# Patient Record
Sex: Female | Born: 1940 | Race: White | Hispanic: No | Marital: Married | Smoking: Former smoker
Health system: Southern US, Community
[De-identification: ages and names within clinical notes are randomized; demographics above are authoritative.]

## PROBLEM LIST (undated history)

## (undated) DIAGNOSIS — E7800 Pure hypercholesterolemia, unspecified: Secondary | ICD-10-CM

## (undated) DIAGNOSIS — M549 Dorsalgia, unspecified: Secondary | ICD-10-CM

## (undated) DIAGNOSIS — Z87442 Personal history of urinary calculi: Secondary | ICD-10-CM

## (undated) DIAGNOSIS — M419 Scoliosis, unspecified: Secondary | ICD-10-CM

## (undated) DIAGNOSIS — K219 Gastro-esophageal reflux disease without esophagitis: Secondary | ICD-10-CM

## (undated) DIAGNOSIS — R0600 Dyspnea, unspecified: Secondary | ICD-10-CM

## (undated) DIAGNOSIS — M1990 Unspecified osteoarthritis, unspecified site: Secondary | ICD-10-CM

## (undated) DIAGNOSIS — E119 Type 2 diabetes mellitus without complications: Secondary | ICD-10-CM

## (undated) DIAGNOSIS — I10 Essential (primary) hypertension: Secondary | ICD-10-CM

## (undated) DIAGNOSIS — H409 Unspecified glaucoma: Secondary | ICD-10-CM

## (undated) DIAGNOSIS — D369 Benign neoplasm, unspecified site: Secondary | ICD-10-CM

## (undated) DIAGNOSIS — K52832 Lymphocytic colitis: Secondary | ICD-10-CM

## (undated) HISTORY — DX: Scoliosis, unspecified: M41.9

## (undated) HISTORY — PX: TONSILLECTOMY: SUR1361

## (undated) HISTORY — DX: Dorsalgia, unspecified: M54.9

## (undated) HISTORY — PX: CATARACT EXTRACTION, BILATERAL: SHX1313

## (undated) HISTORY — DX: Pure hypercholesterolemia, unspecified: E78.00

## (undated) HISTORY — PX: EYE SURGERY: SHX253

## (undated) HISTORY — PX: BACK SURGERY: SHX140

## (undated) HISTORY — PX: GANGLION CYST EXCISION: SHX1691

## (undated) HISTORY — DX: Essential (primary) hypertension: I10

## (undated) HISTORY — PX: ABDOMINAL HYSTERECTOMY: SHX81

## (undated) HISTORY — DX: Benign neoplasm, unspecified site: D36.9

## (undated) HISTORY — DX: Type 2 diabetes mellitus without complications: E11.9

## (undated) HISTORY — DX: Gastro-esophageal reflux disease without esophagitis: K21.9

## (undated) HISTORY — PX: GALLBLADDER SURGERY: SHX652

## (undated) HISTORY — DX: Lymphocytic colitis: K52.832

---

## 2000-04-27 ENCOUNTER — Encounter: Payer: Self-pay | Admitting: Neurosurgery

## 2000-04-28 ENCOUNTER — Inpatient Hospital Stay (HOSPITAL_COMMUNITY): Admission: RE | Admit: 2000-04-28 | Discharge: 2000-04-29 | Payer: Self-pay | Admitting: Neurosurgery

## 2000-04-28 ENCOUNTER — Encounter: Payer: Self-pay | Admitting: Neurosurgery

## 2000-07-23 ENCOUNTER — Ambulatory Visit (HOSPITAL_COMMUNITY): Admission: RE | Admit: 2000-07-23 | Discharge: 2000-07-23 | Payer: Self-pay | Admitting: Neurosurgery

## 2000-07-23 ENCOUNTER — Encounter: Payer: Self-pay | Admitting: Neurosurgery

## 2000-10-07 ENCOUNTER — Encounter: Payer: Self-pay | Admitting: Neurosurgery

## 2000-10-07 ENCOUNTER — Ambulatory Visit (HOSPITAL_COMMUNITY): Admission: RE | Admit: 2000-10-07 | Discharge: 2000-10-07 | Payer: Self-pay | Admitting: Neurosurgery

## 2000-11-03 ENCOUNTER — Encounter: Admission: RE | Admit: 2000-11-03 | Discharge: 2000-11-03 | Payer: Self-pay | Admitting: Neurosurgery

## 2000-11-03 ENCOUNTER — Encounter: Payer: Self-pay | Admitting: Neurosurgery

## 2000-11-17 ENCOUNTER — Encounter: Payer: Self-pay | Admitting: Neurosurgery

## 2000-11-17 ENCOUNTER — Encounter: Admission: RE | Admit: 2000-11-17 | Discharge: 2000-11-17 | Payer: Self-pay | Admitting: Neurosurgery

## 2001-01-31 ENCOUNTER — Ambulatory Visit (HOSPITAL_COMMUNITY): Admission: RE | Admit: 2001-01-31 | Discharge: 2001-01-31 | Payer: Self-pay | Admitting: Family Medicine

## 2001-01-31 ENCOUNTER — Encounter: Payer: Self-pay | Admitting: Family Medicine

## 2001-10-09 ENCOUNTER — Encounter: Payer: Self-pay | Admitting: Neurosurgery

## 2001-10-09 ENCOUNTER — Encounter: Admission: RE | Admit: 2001-10-09 | Discharge: 2001-10-09 | Payer: Self-pay | Admitting: Neurosurgery

## 2001-10-23 ENCOUNTER — Encounter: Admission: RE | Admit: 2001-10-23 | Discharge: 2001-10-23 | Payer: Self-pay | Admitting: Neurosurgery

## 2001-10-23 ENCOUNTER — Encounter: Payer: Self-pay | Admitting: Neurosurgery

## 2002-02-01 ENCOUNTER — Ambulatory Visit (HOSPITAL_COMMUNITY): Admission: RE | Admit: 2002-02-01 | Discharge: 2002-02-01 | Payer: Self-pay | Admitting: Family Medicine

## 2002-02-01 ENCOUNTER — Encounter: Payer: Self-pay | Admitting: Family Medicine

## 2002-02-05 ENCOUNTER — Encounter: Payer: Self-pay | Admitting: Neurosurgery

## 2002-02-05 ENCOUNTER — Encounter: Admission: RE | Admit: 2002-02-05 | Discharge: 2002-02-05 | Payer: Self-pay | Admitting: Neurosurgery

## 2002-11-19 ENCOUNTER — Ambulatory Visit (HOSPITAL_COMMUNITY): Admission: RE | Admit: 2002-11-19 | Discharge: 2002-11-19 | Payer: Self-pay | Admitting: Internal Medicine

## 2003-01-14 ENCOUNTER — Encounter: Payer: Self-pay | Admitting: Family Medicine

## 2003-01-14 ENCOUNTER — Ambulatory Visit (HOSPITAL_COMMUNITY): Admission: RE | Admit: 2003-01-14 | Discharge: 2003-01-14 | Payer: Self-pay | Admitting: Family Medicine

## 2003-02-20 ENCOUNTER — Ambulatory Visit (HOSPITAL_COMMUNITY): Admission: RE | Admit: 2003-02-20 | Discharge: 2003-02-20 | Payer: Self-pay | Admitting: Orthopaedic Surgery

## 2003-02-20 ENCOUNTER — Encounter: Payer: Self-pay | Admitting: Orthopaedic Surgery

## 2003-05-10 ENCOUNTER — Ambulatory Visit (HOSPITAL_COMMUNITY): Admission: RE | Admit: 2003-05-10 | Discharge: 2003-05-10 | Payer: Self-pay | Admitting: Family Medicine

## 2004-08-25 ENCOUNTER — Ambulatory Visit (HOSPITAL_COMMUNITY): Admission: RE | Admit: 2004-08-25 | Discharge: 2004-08-25 | Payer: Self-pay | Admitting: Family Medicine

## 2006-04-07 ENCOUNTER — Ambulatory Visit (HOSPITAL_COMMUNITY): Admission: RE | Admit: 2006-04-07 | Discharge: 2006-04-07 | Payer: Self-pay | Admitting: Family Medicine

## 2011-03-01 HISTORY — PX: COLONOSCOPY: SHX174

## 2013-05-31 DIAGNOSIS — K52832 Lymphocytic colitis: Secondary | ICD-10-CM

## 2013-05-31 DIAGNOSIS — D369 Benign neoplasm, unspecified site: Secondary | ICD-10-CM

## 2013-05-31 HISTORY — DX: Benign neoplasm, unspecified site: D36.9

## 2013-05-31 HISTORY — DX: Lymphocytic colitis: K52.832

## 2014-04-02 ENCOUNTER — Ambulatory Visit (INDEPENDENT_AMBULATORY_CARE_PROVIDER_SITE_OTHER): Payer: Medicare Other | Admitting: Gastroenterology

## 2014-04-02 ENCOUNTER — Encounter: Payer: Self-pay | Admitting: Gastroenterology

## 2014-04-02 ENCOUNTER — Other Ambulatory Visit: Payer: Self-pay

## 2014-04-02 VITALS — BP 140/74 | HR 76 | Temp 97.0°F | Ht 63.5 in | Wt 156.0 lb

## 2014-04-02 DIAGNOSIS — R197 Diarrhea, unspecified: Secondary | ICD-10-CM

## 2014-04-02 MED ORDER — PEG-KCL-NACL-NASULF-NA ASC-C 100 G PO SOLR: 1.0000 | ORAL | Status: DC

## 2014-04-02 NOTE — Patient Instructions (Signed)
Please complete the stool sample.   Start taking a probiotic daily such as Digestive Advantage, Philip's Colon Health, [employer] brand, Align, or Restora.   We have set you up for a colonoscopy with Dr. Gala Romney as soon as possible for further evaluation.

## 2014-04-02 NOTE — Progress Notes (Signed)
Primary Care Physician:  Clinton Quant, MD Primary Gastroenterologist:  Dr. Gala Romney   Chief Complaint  Patient presents with  . Referral    Constant Diarrhea    HPI:   Diane Hawkins is a very pleasant 73 year old female presenting today at the request of her PCP secondary to persistent diarrhea. She is a retired Marine scientist and excellent historian. Last colonoscopy in 2012 by Dr. Algis Greenhouse with adenomatous rectal polyps.   Oct 14th, acute onset of profuse diarrhea at 0430am. No improvement with Imodium. Has had to throw away underpants. Projectile diarrhea. Fecal urgency, incontinence. Prescribed pepto. No improvement. Stool samples completed. Negative Cdiff (toxin A and b) PCR not done. Moderate yeast in stool culture. Diarrhea slowed down some. Watery stool with pieces. By the 27th, frequency slowed down. 28th, 5 times, 29th 6 times. Yesterday soft stool, pieces. Nothing formed since starting Oct 14th. No imodium yesterday. 1 soft stool today. Well water. No sick contacts. No recent antibiotic exposure. Month ago started Cymbalta for back issues. Stopped Cymbalta. Ativan for sleep last 3 nights and has noted some improvement but not sure it is linked. Possible antibiotic months ago for UTI. Scant paper hematochezia. No abdominal pain. Has lost several pounds. Appetite somewhat less but knows she has to eat. Diabetic so keeps up with her oral intake.   Chronic Nexium. Symptomatic if forgets PPI.   Past Medical History  Diagnosis Date  . Diabetes   . Hypertension   . Hypercholesterolemia   . GERD (gastroesophageal reflux disease)   . Back pain   . Scoliosis     Past Surgical History  Procedure Laterality Date  . Back surgery      spinal stenosis  . Colonoscopy  Oct 2012    Dr. Algis Greenhouse: few small rectal polyps s/p biopsy, adenomatous polyps    Current Outpatient Prescriptions  Medication Sig Dispense Refill  . acetaminophen (TYLENOL) 500 MG tablet Take 500 mg by mouth  every 6 (six) hours as needed for headache.     Marland Kitchen amLODipine (NORVASC) 5 MG tablet Take 5 mg by mouth daily.    Marland Kitchen aspirin 500 MG tablet Take 500 mg by mouth daily.    . carvedilol (COREG) 12.5 MG tablet Take 12.5 mg by mouth 2 (two) times daily.    . Cholecalciferol (VITAMIN D PO) Take 1,000 Units by mouth.    . Esomeprazole Magnesium (NEXIUM PO) Take 1 capsule by mouth daily.     . Insulin Aspart Prot & Aspart (NOVOLOG MIX 70/30 [place]) Inject into the skin. 25 units in the AM 10 units in the PM    . Loperamide HCl (IMODIUM PO) Take 1 tablet by mouth daily as needed (diarrhea).     . LORazepam (ATIVAN) 0.5 MG tablet Take 0.5 mg by mouth daily as needed for anxiety.     . rosuvastatin (CRESTOR) 10 MG tablet Take 10 mg by mouth daily.    . peg 3350 powder (MOVIPREP) 100 G SOLR Take 1 kit (200 g total) by mouth as directed. 1 kit 0  . Probiotic Product (RESTORA PO) Take 1 capsule by mouth daily.     No current facility-administered medications for this visit.    Allergies as of 04/02/2014  . (Not on File)    Family History  Problem Relation Age of Onset  . Colon cancer Neg Hx   . Diverticulitis Mother   . Ulcers Mother     History   Social History  . Marital  Status: Married    Spouse Name: N/A    Number of Children: N/A  . Years of Education: N/A   Occupational History  . Retired Therapist, sports    Social History Main Topics  . Smoking status: Current Some Day Smoker    Types: E-cigarettes  . Smokeless tobacco: Not on file     Comment: E-Cigarette  . Alcohol Use: No  . Drug Use: No  . Sexual Activity: Not on file   Other Topics Concern  . Not on file   Social History Narrative    Review of Systems: As mentioned in HPI  Physical Exam: BP 140/74 mmHg  Pulse 76  Temp(Src) 97 F (36.1 C) (Oral)  Ht 5' 3.5" (1.613 m)  Wt 156 lb (70.761 kg)  BMI 27.20 kg/m2 General:   Alert and oriented. Pleasant and cooperative. Well-nourished and well-developed.  Head:  Normocephalic and  atraumatic. Eyes:  Without icterus, sclera clear and conjunctiva pink.  Ears:  Normal auditory acuity. Nose:  No deformity, discharge,  or lesions. Mouth:  No deformity or lesions, oral mucosa pink.  Lungs:  Clear to auscultation bilaterally. No wheezes, rales, or rhonchi. No distress.  Heart:  S1, S2 present without murmurs appreciated.  Abdomen:  +BS, soft, non-tender and non-distended. No HSM noted. No guarding or rebound. No masses appreciated.  Rectal:  Deferred  Msk:  Symmetrical without gross deformities. Normal posture. Extremities:  Without clubbing or edema. Neurologic:  Alert and  oriented x4;  grossly normal neurologically. Skin:  Intact without significant lesions or rashes. Psych:  Alert and cooperative. Normal mood and affect.

## 2014-04-05 ENCOUNTER — Encounter: Payer: Self-pay | Admitting: Gastroenterology

## 2014-04-05 DIAGNOSIS — R197 Diarrhea, unspecified: Secondary | ICD-10-CM | POA: Insufficient documentation

## 2014-04-05 LAB — CLOSTRIDIUM DIFFICILE BY PCR: Toxigenic C. Difficile by PCR: NOT DETECTED

## 2014-04-05 NOTE — Assessment & Plan Note (Signed)
73 year old female with acute onset of diarrhea several weeks ago, associated urgency and incontinence, several lbs weight loss, with negative stool studies to include Cdiff toxin A & B, ova and parasites. Non-specific finding of moderate yeast in stool culture. Last colonoscopy 2012 at outside facility with adenomatous colon polyps. Due to significant change in bowel habits and associated weight loss, needs updated colonoscopy with random colonic biopsies. I would like to recheck Cdiff by PCR for completeness' sake. Colonoscopy planned in near future. Start probiotic now.   Proceed with TCS with Dr. Gala Romney in near future: the risks, benefits, and alternatives have been discussed with the patient in detail. The patient states understanding and desires to proceed.

## 2014-04-08 ENCOUNTER — Ambulatory Visit: Payer: Self-pay | Admitting: Gastroenterology

## 2014-04-09 ENCOUNTER — Encounter (HOSPITAL_COMMUNITY): Payer: Self-pay | Admitting: *Deleted

## 2014-04-09 ENCOUNTER — Encounter (HOSPITAL_COMMUNITY)
Admission: RE | Disposition: A | Discharge status: Home / Self Care | Payer: Self-pay | Source: Ambulatory Visit | Attending: Internal Medicine

## 2014-04-09 ENCOUNTER — Ambulatory Visit (HOSPITAL_COMMUNITY)
Admission: RE | Admit: 2014-04-09 | Discharge: 2014-04-09 | Disposition: A | Discharge status: Home / Self Care | Payer: Medicare Other | Source: Ambulatory Visit | Attending: Internal Medicine | Admitting: Internal Medicine

## 2014-04-09 DIAGNOSIS — K5730 Diverticulosis of large intestine without perforation or abscess without bleeding: Secondary | ICD-10-CM | POA: Insufficient documentation

## 2014-04-09 DIAGNOSIS — M419 Scoliosis, unspecified: Secondary | ICD-10-CM | POA: Insufficient documentation

## 2014-04-09 DIAGNOSIS — K219 Gastro-esophageal reflux disease without esophagitis: Secondary | ICD-10-CM | POA: Insufficient documentation

## 2014-04-09 DIAGNOSIS — D122 Benign neoplasm of ascending colon: Secondary | ICD-10-CM | POA: Insufficient documentation

## 2014-04-09 DIAGNOSIS — Z7982 Long term (current) use of aspirin: Secondary | ICD-10-CM | POA: Diagnosis not present

## 2014-04-09 DIAGNOSIS — K5289 Other specified noninfective gastroenteritis and colitis: Secondary | ICD-10-CM | POA: Diagnosis not present

## 2014-04-09 DIAGNOSIS — E119 Type 2 diabetes mellitus without complications: Secondary | ICD-10-CM | POA: Insufficient documentation

## 2014-04-09 DIAGNOSIS — R197 Diarrhea, unspecified: Secondary | ICD-10-CM

## 2014-04-09 DIAGNOSIS — Z86010 Personal history of colonic polyps: Secondary | ICD-10-CM

## 2014-04-09 DIAGNOSIS — E780 Pure hypercholesterolemia: Secondary | ICD-10-CM | POA: Insufficient documentation

## 2014-04-09 DIAGNOSIS — I10 Essential (primary) hypertension: Secondary | ICD-10-CM | POA: Diagnosis not present

## 2014-04-09 HISTORY — PX: COLONOSCOPY: SHX5424

## 2014-04-09 LAB — GLUCOSE, CAPILLARY: GLUCOSE-CAPILLARY: 182 mg/dL — AB (ref 70–99)

## 2014-04-09 SURGERY — COLONOSCOPY
Anesthesia: Moderate Sedation

## 2014-04-09 MED ORDER — MEPERIDINE HCL 100 MG/ML IJ SOLN
INTRAMUSCULAR | Status: AC
  Filled 2014-04-09: qty 2

## 2014-04-09 MED ORDER — ONDANSETRON HCL 4 MG/2ML IJ SOLN
INTRAMUSCULAR | Status: DC | PRN
  Administered 2014-04-09: 4 mg via INTRAVENOUS

## 2014-04-09 MED ORDER — ONDANSETRON HCL 4 MG/2ML IJ SOLN
INTRAMUSCULAR | Status: AC
  Filled 2014-04-09: qty 2

## 2014-04-09 MED ORDER — MIDAZOLAM HCL 5 MG/5ML IJ SOLN
INTRAMUSCULAR | Status: DC | PRN
  Administered 2014-04-09 (×2): 2 mg via INTRAVENOUS
  Administered 2014-04-09: 1 mg via INTRAVENOUS

## 2014-04-09 MED ORDER — MIDAZOLAM HCL 5 MG/5ML IJ SOLN
INTRAMUSCULAR | Status: AC
  Filled 2014-04-09: qty 10

## 2014-04-09 MED ORDER — MEPERIDINE HCL 100 MG/ML IJ SOLN
INTRAMUSCULAR | Status: DC | PRN
  Administered 2014-04-09: 50 mg via INTRAVENOUS
  Administered 2014-04-09: 25 mg via INTRAVENOUS

## 2014-04-09 MED ORDER — STERILE WATER FOR IRRIGATION IR SOLN
Status: DC | PRN
  Administered 2014-04-09: 14:00:00

## 2014-04-09 MED ORDER — SODIUM CHLORIDE 0.9 % IV SOLN
INTRAVENOUS | Status: DC
  Administered 2014-04-09: 13:00:00 via INTRAVENOUS

## 2014-04-09 NOTE — Discharge Instructions (Addendum)
Colonoscopy Discharge Instructions  Read the instructions outlined below and refer to this sheet in the next few weeks. These discharge instructions provide you with general information on caring for yourself after you leave the hospital. Your doctor may also give you specific instructions. While your treatment has been planned according to the most current medical practices available, unavoidable complications occasionally occur. If you have any problems or questions after discharge, call Dr. Gala Hawkins at [phone]. ACTIVITY  You may resume your regular activity, but move at a slower pace for the next 24 hours.   Take frequent rest periods for the next 24 hours.   Walking will help get rid of the air and reduce the bloated feeling in your belly (abdomen).   No driving for 24 hours (because of the medicine (anesthesia) used during the test).    Do not sign any important legal documents or operate any machinery for 24 hours (because of the anesthesia used during the test).  NUTRITION  Drink plenty of fluids.   You may resume your normal diet as instructed by your doctor.   Begin with a light meal and progress to your normal diet. Heavy or fried foods are harder to digest and may make you feel sick to your stomach (nauseated).   Avoid alcoholic beverages for 24 hours or as instructed.  MEDICATIONS  You may resume your normal medications unless your doctor tells you otherwise.  WHAT YOU CAN EXPECT TODAY  Some feelings of bloating in the abdomen.   Passage of more gas than usual.   Spotting of blood in your stool or on the toilet paper.  IF YOU HAD POLYPS REMOVED DURING THE COLONOSCOPY:  No aspirin products for 7 days or as instructed.   No alcohol for 7 days or as instructed.   Eat a soft diet for the next 24 hours.  FINDING OUT THE RESULTS OF YOUR TEST Not all test results are available during your visit. If your test results are not back during the visit, make an appointment  with your caregiver to find out the results. Do not assume everything is normal if you have not heard from your caregiver or the medical facility. It is important for you to follow up on all of your test results.  SEEK IMMEDIATE MEDICAL ATTENTION IF:  You have more than a spotting of blood in your stool.   Your belly is swollen (abdominal distention).   You are nauseated or vomiting.   You have a temperature over 101.   You have abdominal pain or discomfort that is severe or gets worse throughout the day.    Diverticulosis and polyp information provided  Further recommendations to follow pending review of laboratory studies  Diverticulosis Diverticulosis is the condition that develops when small pouches (diverticula) form in the wall of your colon. Your colon, or large intestine, is where water is absorbed and stool is formed. The pouches form when the inside layer of your colon pushes through weak spots in the outer layers of your colon. CAUSES  No one knows exactly what causes diverticulosis. RISK FACTORS  Being older than 36. Your risk for this condition increases with age. Diverticulosis is rare in people younger than 40 years. By age 50, almost everyone has it.  Eating a low-fiber diet.  Being frequently constipated.  Being overweight.  Not getting enough exercise.  Smoking.  Taking over-the-counter pain medicines, like aspirin and ibuprofen. SYMPTOMS  Most people with diverticulosis do not have symptoms. DIAGNOSIS  Because  diverticulosis often has no symptoms, health care providers often discover the condition during an exam for other colon problems. In many cases, a health care provider will diagnose diverticulosis while using a flexible scope to examine the colon (colonoscopy). °TREATMENT  °If you have never developed an infection related to diverticulosis, you may not need treatment. If you have had an infection before, treatment may include: °· Eating more fruits,  vegetables, and grains. °· Taking a fiber supplement. °· Taking a live bacteria supplement (probiotic). °· Taking medicine to relax your colon. °HOME CARE INSTRUCTIONS  °· Drink at least 6-8 glasses of water each day to prevent constipation. °· Try not to strain when you have a bowel movement. °· Keep all follow-up appointments. °If you have had an infection before:  °· Increase the fiber in your diet as directed by your health care provider or dietitian. °· Take a dietary fiber supplement if your health care provider approves. °· Only take medicines as directed by your health care provider. °SEEK MEDICAL CARE IF:  °· You have abdominal pain. °· You have bloating. °· You have cramps. °· You have not gone to the bathroom in 3 days. °SEEK IMMEDIATE MEDICAL CARE IF:  °· Your pain gets worse. °· Your bloating becomes very bad. °· You have a fever or chills, and your symptoms suddenly get worse. °· You begin vomiting. °· You have bowel movements that are bloody or black. °MAKE SURE YOU: °· Understand these instructions. °· Will watch your condition. °· Will get help right away if you are not doing well or get worse. °Document Released: 02/12/2004 Document Revised: 05/22/2013 Document Reviewed: 04/11/2013 °ExitCare® Patient Information ©2015 [employer]. This information is not intended to replace advice given to you by your health care provider. Make sure you discuss any questions you have with your health care provider. ° ° °Colon Polyps °Polyps are lumps of extra tissue growing inside the body. Polyps can grow in the large intestine (colon). Most colon polyps are noncancerous (benign). However, some colon polyps can become cancerous over time. Polyps that are larger than a pea may be harmful. To be safe, caregivers remove and test all polyps. °CAUSES  °Polyps form when mutations in the genes cause your cells to grow and divide even though no more tissue is needed. °RISK FACTORS °There are a number of risk factors  that can increase your chances of getting colon polyps. They include: °· Being older than 50 years. °· Family history of colon polyps or colon cancer. °· Long-term colon diseases, such as colitis or Crohn disease. °· Being overweight. °· Smoking. °· Being inactive. °· Drinking too much alcohol. °SYMPTOMS  °Most small polyps do not cause symptoms. If symptoms are present, they may include: °· Blood in the stool. The stool may look dark red or black. °· Constipation or diarrhea that lasts longer than 1 week. °DIAGNOSIS °People often do not know they have polyps until their caregiver finds them during a regular checkup. Your caregiver can use 4 tests to check for polyps: °· Digital rectal exam. The caregiver wears gloves and feels inside the rectum. This test would find polyps only in the rectum. °· Barium enema. The caregiver puts a liquid called barium into your rectum before taking X-rays of your colon. Barium makes your colon look white. Polyps are dark, so they are easy to see in the X-ray pictures. °· Sigmoidoscopy. A thin, flexible tube (sigmoidoscope) is placed into your rectum. The sigmoidoscope has a light and tiny camera   in it. The caregiver uses the sigmoidoscope to look at the last third of your colon.  Colonoscopy. This test is like sigmoidoscopy, but the caregiver looks at the entire colon. This is the most common method for finding and removing polyps. TREATMENT  Any polyps will be removed during a sigmoidoscopy or colonoscopy. The polyps are then tested for cancer. PREVENTION  To help lower your risk of getting more colon polyps:  Eat plenty of fruits and vegetables. Avoid eating fatty foods.  Do not smoke.  Avoid drinking alcohol.  Exercise every day.  Lose weight if recommended by your caregiver.  Eat plenty of calcium and folate. Foods that are rich in calcium include milk, cheese, and broccoli. Foods that are rich in folate include chickpeas, kidney beans, and spinach. HOME CARE  INSTRUCTIONS Keep all follow-up appointments as directed by your caregiver. You may need periodic exams to check for polyps. SEEK MEDICAL CARE IF: You notice bleeding during a bowel movement. Document Released: 02/11/2004 Document Revised: 08/09/2011 Document Reviewed: 07/27/2011 [facility] Patient Information 2015 [place], [place]. This information is not intended to replace advice given to you by your health care provider. Make sure you discuss any questions you have with your health care provider.

## 2014-04-09 NOTE — H&P (View-Only) (Signed)
Primary Care Physician:  Clinton Quant, MD Primary Gastroenterologist:  Dr. Gala Romney   Chief Complaint  Patient presents with  . Referral    Constant Diarrhea    HPI:   Diane Hawkins is a very pleasant 73 year old female presenting today at the request of her PCP secondary to persistent diarrhea. She is a retired Marine scientist and excellent historian. Last colonoscopy in 2012 by Dr. Algis Greenhouse with adenomatous rectal polyps.   Oct 14th, acute onset of profuse diarrhea at 0430am. No improvement with Imodium. Has had to throw away underpants. Projectile diarrhea. Fecal urgency, incontinence. Prescribed pepto. No improvement. Stool samples completed. Negative Cdiff (toxin A and b) PCR not done. Moderate yeast in stool culture. Diarrhea slowed down some. Watery stool with pieces. By the 27th, frequency slowed down. 28th, 5 times, 29th 6 times. Yesterday soft stool, pieces. Nothing formed since starting Oct 14th. No imodium yesterday. 1 soft stool today. Well water. No sick contacts. No recent antibiotic exposure. Month ago started Cymbalta for back issues. Stopped Cymbalta. Ativan for sleep last 3 nights and has noted some improvement but not sure it is linked. Possible antibiotic months ago for UTI. Scant paper hematochezia. No abdominal pain. Has lost several pounds. Appetite somewhat less but knows she has to eat. Diabetic so keeps up with her oral intake.   Chronic Nexium. Symptomatic if forgets PPI.   Past Medical History  Diagnosis Date  . Diabetes   . Hypertension   . Hypercholesterolemia   . GERD (gastroesophageal reflux disease)   . Back pain   . Scoliosis     Past Surgical History  Procedure Laterality Date  . Back surgery      spinal stenosis  . Colonoscopy  Oct 2012    Dr. Algis Greenhouse: few small rectal polyps s/p biopsy, adenomatous polyps    Current Outpatient Prescriptions  Medication Sig Dispense Refill  . acetaminophen (TYLENOL) 500 MG tablet Take 500 mg by mouth  every 6 (six) hours as needed for headache.     Marland Kitchen amLODipine (NORVASC) 5 MG tablet Take 5 mg by mouth daily.    Marland Kitchen aspirin 500 MG tablet Take 500 mg by mouth daily.    . carvedilol (COREG) 12.5 MG tablet Take 12.5 mg by mouth 2 (two) times daily.    . Cholecalciferol (VITAMIN D PO) Take 1,000 Units by mouth.    . Esomeprazole Magnesium (NEXIUM PO) Take 1 capsule by mouth daily.     . Insulin Aspart Prot & Aspart (NOVOLOG MIX 70/30 [place]) Inject into the skin. 25 units in the AM 10 units in the PM    . Loperamide HCl (IMODIUM PO) Take 1 tablet by mouth daily as needed (diarrhea).     . LORazepam (ATIVAN) 0.5 MG tablet Take 0.5 mg by mouth daily as needed for anxiety.     . rosuvastatin (CRESTOR) 10 MG tablet Take 10 mg by mouth daily.    . peg 3350 powder (MOVIPREP) 100 G SOLR Take 1 kit (200 g total) by mouth as directed. 1 kit 0  . Probiotic Product (RESTORA PO) Take 1 capsule by mouth daily.     No current facility-administered medications for this visit.    Allergies as of 04/02/2014  . (Not on File)    Family History  Problem Relation Age of Onset  . Colon cancer Neg Hx   . Diverticulitis Mother   . Ulcers Mother     History   Social History  . Marital  Status: Married    Spouse Name: N/A    Number of Children: N/A  . Years of Education: N/A   Occupational History  . Retired Therapist, sports    Social History Main Topics  . Smoking status: Current Some Day Smoker    Types: E-cigarettes  . Smokeless tobacco: Not on file     Comment: E-Cigarette  . Alcohol Use: No  . Drug Use: No  . Sexual Activity: Not on file   Other Topics Concern  . Not on file   Social History Narrative    Review of Systems: As mentioned in HPI  Physical Exam: BP 140/74 mmHg  Pulse 76  Temp(Src) 97 F (36.1 C) (Oral)  Ht 5' 3.5" (1.613 m)  Wt 156 lb (70.761 kg)  BMI 27.20 kg/m2 General:   Alert and oriented. Pleasant and cooperative. Well-nourished and well-developed.  Head:  Normocephalic and  atraumatic. Eyes:  Without icterus, sclera clear and conjunctiva pink.  Ears:  Normal auditory acuity. Nose:  No deformity, discharge,  or lesions. Mouth:  No deformity or lesions, oral mucosa pink.  Lungs:  Clear to auscultation bilaterally. No wheezes, rales, or rhonchi. No distress.  Heart:  S1, S2 present without murmurs appreciated.  Abdomen:  +BS, soft, non-tender and non-distended. No HSM noted. No guarding or rebound. No masses appreciated.  Rectal:  Deferred  Msk:  Symmetrical without gross deformities. Normal posture. Extremities:  Without clubbing or edema. Neurologic:  Alert and  oriented x4;  grossly normal neurologically. Skin:  Intact without significant lesions or rashes. Psych:  Alert and cooperative. Normal mood and affect.

## 2014-04-09 NOTE — Op Note (Signed)
[facility] [address] [place], [address]   COLONOSCOPY PROCEDURE REPORT  PATIENT: Diane Hawkins, Diane Hawkins  MR#: [id number] BIRTHDATE: [birth year] , 73  yrs. old GENDER: female ENDOSCOPIST: R.  Garfield Cornea, MD FACP [facility] REFERRED XB:JYNWGN Helyn App, M.D. PROCEDURE DATE:  04-14-14 PROCEDURE:   Colonoscopy with cold biopsy polypectomy and Colonoscopy with biopsy / stool sampling INDICATIONS:Chronic diarrhea. MEDICATIONS: Versed 5 mg IV and Demerol 75 mg IV in divided doses. Zofran 4 mg IV. ASA CLASS:       Class II  CONSENT: The risks, benefits, alternatives and imponderables including but not limited to bleeding, perforation as well as the possibility of a missed lesion have been reviewed.  The potential for biopsy, lesion removal, etc. have also been discussed. Questions have been answered.  All parties agreeable.  Please see the history and physical in the medical record for more information.  DESCRIPTION OF PROCEDURE:   After the risks benefits and alternatives of the procedure were thoroughly explained, informed consent was obtained.  The digital rectal exam revealed a normal rectal and perianal exam, revealed no abnormalities of the perianal region, and revealed no abnormalities of the rectum.   The EC-[id number] ([id number])  endoscope was introduced through the anus and advanced to the terminal ileum which was intubated for a short distance. No adverse events experienced.   The quality of the prep was adequate.  The instrument was then slowly withdrawn as the colon was fully examined.      COLON FINDINGS: Normal rectum.  Few scattered sigmoid diverticula; (1) 4 mm polyp in the mid ascending segment; the colonic mucosa appeared to be somewhat tender having "crpe paper" consistency Diffusely.  However, otherwise, it appeared normal.  Segmental biopsies of the right and left colon taken for histology. The ascending colon polyp was cold biopsied/removed.   A stool sample was submitted for GI pathogen panel.  Retroflexion was not performed.because the rectal vault was small. The rectal mucosa was seen very well on?"face .  Withdrawal time=11 minutes 0 seconds.  The scope was withdrawn and the procedure completed. COMPLICATIONS: There were no immediate complications.  ENDOSCOPIC IMPRESSION: Colonic diverticulosis. Colonic polyp removed as described above. Subtly abnormal colonic mucosa diffusely?"status post segmental biopsy. The patient may have microscopic colitis.  RECOMMENDATIONS: Follow-up on pending studies. Further recommendations to follow.  eSigned:  R. Garfield Cornea, MD Rosalita Chessman [facility] April 14, 2014 2:35 PM   cc:  CPT CODES: ICD CODES:  The ICD and CPT codes recommended by this software are interpretations from the data that the clinical staff has captured with the software.  The verification of the translation of this report to the ICD and CPT codes and modifiers is the sole responsibility of the health care institution and practicing physician where this report was generated.  [place]. will not be held responsible for the validity of the ICD and CPT codes included on this report.  AMA assumes no liability for data contained or not contained herein. CPT is a Designer, television/film set of the [employer].  PATIENT NAME:  Diane Hawkins, Diane Hawkins MR#: [id number]

## 2014-04-09 NOTE — Interval H&P Note (Addendum)
History and Physical Interval Note:  04/09/2014 1:54 PM  Diane Hawkins  has presented today for surgery, with the diagnosis of diarrhea  The various methods of treatment have been discussed with the patient and family. After consideration of risks, benefits and other options for treatment, the patient has consented to  Procedure(s) with comments: COLONOSCOPY (N/A) - 200 as a surgical intervention .  The patient's history has been reviewed, patient examined, no change in status, stable for surgery.  I have reviewed the patient's chart and labs.  Questions were answered to the patient's satisfaction.     Marlei Glomski  C. Difficile negative. No change otherwise per colonoscopy per plan.The risks, benefits, limitations, alternatives and imponderables have been reviewed with the patient. Questions have been answered. All parties are agreeable.

## 2014-04-09 NOTE — Progress Notes (Signed)
cc'ed to pcp °

## 2014-04-10 LAB — GI PATHOGEN PANEL BY PCR, STOOL
C difficile toxin A/B: NEGATIVE
CAMPYLOBACTER BY PCR: NEGATIVE
Cryptosporidium by PCR: NEGATIVE
E coli (ETEC) LT/ST: NEGATIVE
E coli (STEC): NEGATIVE
E coli 0157 by PCR: NEGATIVE
G LAMBLIA BY PCR: NEGATIVE
NOROVIRUS G1/G2: NEGATIVE
ROTAVIRUS A BY PCR: NEGATIVE
SHIGELLA BY PCR: NEGATIVE
Salmonella by PCR: NEGATIVE

## 2014-04-12 ENCOUNTER — Encounter (HOSPITAL_COMMUNITY): Payer: Self-pay | Admitting: Internal Medicine

## 2014-04-16 ENCOUNTER — Encounter: Payer: Self-pay | Admitting: Internal Medicine

## 2014-04-16 ENCOUNTER — Telehealth: Payer: Self-pay | Admitting: Internal Medicine

## 2014-04-16 NOTE — Telephone Encounter (Signed)
PATIENT STATES THAT SHE IS HAVING DIARRHEA  AND WANTS TO KNOW WHAT PLAN OF ACTION WILL BE TAKEN FOR THIS.  CAN SHE BE GIVEN SOMETHING TO HELP PRIOR TO HER UPCOMING OFFICE VISIT? PLEASE ADVISE.

## 2014-04-17 MED ORDER — BUDESONIDE 3 MG PO CP24: 6.0000 mg | ORAL_CAPSULE | Freq: Every day | ORAL | Status: DC

## 2014-04-17 NOTE — Telephone Encounter (Signed)
SPOKE TO PATIENT AND PUT HER ON RECALL LIST FOR January.  WILL CALL HER AS SOON AS RMR January SCHEDULE OUT.

## 2014-04-17 NOTE — Addendum Note (Signed)
Addended by: Claudina Lick on: 04/17/2014 03:45 PM   Modules accepted: Orders

## 2014-04-17 NOTE — Telephone Encounter (Signed)
Per RMR's letter pt needs rx for entocort 6mg  daily x 2 months. No refills and pt needs ov with RMR in 6 weeks. Pt is aware. rx sent to the pharmacy.   Erline Levine, pt said she was already scheduled for ov on 05/28/14 but she cannot come that day, she would like to be rescheduled. She said you can call her with that appt or send it to her by mychart.

## 2014-04-18 NOTE — Telephone Encounter (Signed)
Pt got a call from the pharmacy today, entocort is $425.00, she is in the doughnut hole. She wants to know if there is anything else she can try.

## 2014-04-19 MED ORDER — BUDESONIDE 9 MG PO TB24: 1.0000 | ORAL_TABLET | Freq: Every day | ORAL | Status: DC

## 2014-04-19 NOTE — Telephone Encounter (Signed)
Routing to Burnadette Peter, LPN

## 2014-04-19 NOTE — Telephone Encounter (Signed)
Pt is aware. She said she would check with the pharmacy about price. Samples are at the front desk.

## 2014-04-19 NOTE — Telephone Encounter (Signed)
We've called the company and they don't give samples of Entocort.  We haven't received a sample in over 7 years.

## 2014-04-19 NOTE — Telephone Encounter (Signed)
entocort is best; can we get her samples; further delays in treatment will only prolong her sx.

## 2014-04-19 NOTE — Telephone Encounter (Signed)
As there are no samples of entocort and it is too expensive due to insurance, could try Uceris 9 mg X 2 months. IF this is too expensive, could do pepto bismol. We have enough samples of uceris for 1 month, and I sent a prescription for 1 month for a total of 2. Let's see what the out of pocket expense will be for her.

## 2014-04-19 NOTE — Addendum Note (Signed)
Addended by: Orvil Feil on: 04/19/2014 12:13 PM   Modules accepted: Orders

## 2014-04-21 ENCOUNTER — Encounter: Payer: Self-pay | Admitting: Internal Medicine

## 2014-04-21 NOTE — Progress Notes (Signed)
Patient ID: Diane Hawkins, female   DOB: [birth year], 73 y.o.   MRN: [id number]   Further options of treatment of microscopic colitis:    Could try Uceris 9 mg daily x 1 month or Lialda 4.8 grams daily (would need a baseline creatinine).  We should avoid prednisone;  pepto bismal likely not very effective.

## 2014-04-22 NOTE — Progress Notes (Signed)
Pt is aware, uceris has already been sent in, pt to call us back if it is too expensive.

## 2014-04-23 ENCOUNTER — Encounter: Payer: Self-pay | Admitting: Internal Medicine

## 2014-05-03 ENCOUNTER — Emergency Department (HOSPITAL_COMMUNITY): Payer: Medicare Other

## 2014-05-03 ENCOUNTER — Encounter (HOSPITAL_COMMUNITY): Payer: Self-pay | Admitting: *Deleted

## 2014-05-03 ENCOUNTER — Inpatient Hospital Stay (HOSPITAL_COMMUNITY)
Admission: EM | Admit: 2014-05-03 | Discharge: 2014-05-05 | DRG: 446 | Disposition: A | Discharge status: Home / Self Care | Payer: Medicare Other | Attending: Internal Medicine | Admitting: Internal Medicine

## 2014-05-03 DIAGNOSIS — M419 Scoliosis, unspecified: Secondary | ICD-10-CM | POA: Diagnosis present

## 2014-05-03 DIAGNOSIS — I10 Essential (primary) hypertension: Secondary | ICD-10-CM | POA: Diagnosis present

## 2014-05-03 DIAGNOSIS — E7800 Pure hypercholesterolemia, unspecified: Secondary | ICD-10-CM

## 2014-05-03 DIAGNOSIS — R7401 Elevation of levels of liver transaminase levels: Secondary | ICD-10-CM | POA: Insufficient documentation

## 2014-05-03 DIAGNOSIS — Z23 Encounter for immunization: Secondary | ICD-10-CM

## 2014-05-03 DIAGNOSIS — K52839 Microscopic colitis, unspecified: Secondary | ICD-10-CM

## 2014-05-03 DIAGNOSIS — K8050 Calculus of bile duct without cholangitis or cholecystitis without obstruction: Secondary | ICD-10-CM

## 2014-05-03 DIAGNOSIS — R1013 Epigastric pain: Secondary | ICD-10-CM | POA: Diagnosis not present

## 2014-05-03 DIAGNOSIS — R740 Nonspecific elevation of levels of transaminase and lactic acid dehydrogenase [LDH]: Secondary | ICD-10-CM

## 2014-05-03 DIAGNOSIS — K5289 Other specified noninfective gastroenteritis and colitis: Secondary | ICD-10-CM | POA: Diagnosis present

## 2014-05-03 DIAGNOSIS — K8051 Calculus of bile duct without cholangitis or cholecystitis with obstruction: Principal | ICD-10-CM | POA: Diagnosis present

## 2014-05-03 DIAGNOSIS — F17200 Nicotine dependence, unspecified, uncomplicated: Secondary | ICD-10-CM | POA: Diagnosis present

## 2014-05-03 DIAGNOSIS — Z7982 Long term (current) use of aspirin: Secondary | ICD-10-CM

## 2014-05-03 DIAGNOSIS — E876 Hypokalemia: Secondary | ICD-10-CM | POA: Diagnosis present

## 2014-05-03 DIAGNOSIS — E785 Hyperlipidemia, unspecified: Secondary | ICD-10-CM | POA: Diagnosis present

## 2014-05-03 DIAGNOSIS — R1010 Upper abdominal pain, unspecified: Secondary | ICD-10-CM

## 2014-05-03 DIAGNOSIS — R109 Unspecified abdominal pain: Secondary | ICD-10-CM

## 2014-05-03 DIAGNOSIS — Z833 Family history of diabetes mellitus: Secondary | ICD-10-CM

## 2014-05-03 DIAGNOSIS — E119 Type 2 diabetes mellitus without complications: Secondary | ICD-10-CM

## 2014-05-03 DIAGNOSIS — Z794 Long term (current) use of insulin: Secondary | ICD-10-CM

## 2014-05-03 DIAGNOSIS — K219 Gastro-esophageal reflux disease without esophagitis: Secondary | ICD-10-CM | POA: Diagnosis present

## 2014-05-03 DIAGNOSIS — E109 Type 1 diabetes mellitus without complications: Secondary | ICD-10-CM

## 2014-05-03 DIAGNOSIS — Z90710 Acquired absence of both cervix and uterus: Secondary | ICD-10-CM

## 2014-05-03 MED ORDER — ONDANSETRON HCL 4 MG/2ML IJ SOLN
4.0000 mg | Freq: Once | INTRAMUSCULAR | Status: AC
  Administered 2014-05-04: 4 mg via INTRAVENOUS

## 2014-05-03 MED ORDER — SODIUM CHLORIDE 0.9 % IV BOLUS (SEPSIS)
1000.0000 mL | Freq: Once | INTRAVENOUS | Status: AC
  Administered 2014-05-04: 1000 mL via INTRAVENOUS

## 2014-05-03 MED ORDER — PANTOPRAZOLE SODIUM 40 MG IV SOLR
40.0000 mg | Freq: Once | INTRAVENOUS | Status: AC
  Administered 2014-05-04: 40 mg via INTRAVENOUS

## 2014-05-03 MED ORDER — GI COCKTAIL ~~LOC~~
30.0000 mL | Freq: Once | ORAL | Status: AC
  Administered 2014-05-04: 30 mL via ORAL

## 2014-05-03 NOTE — ED Notes (Signed)
Upper abd pain , intermittently for 4 days.nausea, no sob.

## 2014-05-03 NOTE — ED Provider Notes (Signed)
CSN: [id number]     Arrival date & time 05/03/14  2222 History  This chart was scribed for Ezequiel Essex, MD by Edison Simon, ED Scribe. This patient was seen in room A306/A306-01 and the patient's care was started at 11:24 PM.    Chief Complaint  Patient presents with  . Abdominal Pain   The history is provided by the patient. No language interpreter was used.    HPI Comments: Diane Hawkins is a 73 y.o. female who presents to the Emergency Department complaining of intermittent, non-radiating epigaastric abdominal pain with onset 4 days ago. She states she has had 4 episodes, lasting a few hours each time. She states her current episode began at 1800 today and has been constant since then. She reports associated nausea. She states she bicarbonate has improved her symptoms. She states she had a colonoscopy a few weeks ago with Dr. Gala Romney for explosive diarrhea that revealed evidence of lymphocytic colitis for which she has been using a steroid since 11/21. She states her current pain is pain with her diarrhea. She reports history of GERD for which she takes Nexium but states her pain is unlike her GERD pain. She denies history of abdominal surgery. She reports history of DM controlled by insulin. She denies history of cardiac problems. She denies vomiting, fever, cough chest pain, shortness of breath, or bowel abnormalities. She denies blood in stool or dark stool except after she used Pepto.  PCP: Clinton Quant, MD   Past Medical History  Diagnosis Date  . Diabetes   . Hypertension   . Hypercholesterolemia   . GERD (gastroesophageal reflux disease)   . Back pain   . Scoliosis    Past Surgical History  Procedure Laterality Date  . Back surgery      spinal stenosis  . Colonoscopy  Oct 2012    Dr. Algis Greenhouse: few small rectal polyps s/p biopsy, adenomatous polyps  . Colonoscopy N/A 04/09/2014    Procedure: COLONOSCOPY;  Surgeon: Daneil Dolin, MD;  Location: [facility];   Service: Endoscopy;  Laterality: N/A;  200  . Abdominal hysterectomy     Family History  Problem Relation Age of Onset  . Colon cancer Neg Hx   . Diverticulitis Mother   . Ulcers Mother    History  Substance Use Topics  . Smoking status: Current Some Day Smoker    Types: E-cigarettes  . Smokeless tobacco: Not on file     Comment: E-Cigarette  . Alcohol Use: No   OB History    No data available     Review of Systems A complete 10 system review of systems was obtained and all systems are negative except as noted in the HPI and PMH.    Allergies  Codeine and Erythromycin  Home Medications   Prior to Admission medications   Medication Sig Start Date End Date Taking? Authorizing Provider  acetaminophen (TYLENOL) 500 MG tablet Take 500 mg by mouth every 6 (six) hours as needed for headache.    Yes Historical Provider, MD  amLODipine (NORVASC) 5 MG tablet Take 5 mg by mouth daily.   Yes Historical Provider, MD  aspirin 500 MG tablet Take 500 mg by mouth daily.   Yes Historical Provider, MD  Budesonide (UCERIS) 9 MG TB24 Take 1 capsule by mouth daily. 04/19/14  Yes Orvil Feil, NP  carvedilol (COREG) 12.5 MG tablet Take 12.5 mg by mouth 2 (two) times daily.   Yes Historical Provider, MD  Cholecalciferol (  VITAMIN D PO) Take 1,000 Units by mouth.   Yes Historical Provider, MD  Esomeprazole Magnesium (NEXIUM PO) Take 1 capsule by mouth daily.    Yes Historical Provider, MD  Insulin Aspart Prot & Aspart (NOVOLOG MIX 70/30 Magnet) Inject into the skin. 25 units in the AM 10 units in the PM   Yes Historical Provider, MD  Loperamide HCl (IMODIUM PO) Take 1 tablet by mouth daily as needed (diarrhea).    Yes Historical Provider, MD  LORazepam (ATIVAN) 0.5 MG tablet Take 0.5 mg by mouth daily as needed for anxiety.    Yes Historical Provider, MD  peg 3350 powder (MOVIPREP) 100 G SOLR Take 1 kit (200 g total) by mouth as directed. 04/02/14  Yes Daneil Dolin, MD  Probiotic Product (RESTORA PO)  Take 1 capsule by mouth daily.   Yes Historical Provider, MD  rosuvastatin (CRESTOR) 10 MG tablet Take 10 mg by mouth daily.   Yes Historical Provider, MD   BP 141/56 mmHg  Pulse 77  Temp(Src) 98.4 F (36.9 C) (Oral)  Resp 20  Ht 5' 3.5" (1.613 m)  Wt 147 lb 11.2 oz (66.996 kg)  BMI 25.75 kg/m2  SpO2 92% Physical Exam  Constitutional: She is oriented to person, place, and time. She appears well-developed and well-nourished. No distress.  HENT:  Head: Normocephalic and atraumatic.  Mouth/Throat: Oropharynx is clear and moist. No oropharyngeal exudate.  Eyes: Conjunctivae and EOM are normal. Pupils are equal, round, and reactive to light.  Neck: Normal range of motion. Neck supple.  No meningismus.  Cardiovascular: Normal rate, regular rhythm, normal heart sounds and intact distal pulses.   No murmur heard. Pulmonary/Chest: Effort normal and breath sounds normal. No respiratory distress.  Abdominal: Soft. There is tenderness (Tenderness to epigastrium and RUQ). There is no rebound, no guarding and no CVA tenderness.  Musculoskeletal: Normal range of motion. She exhibits no edema or tenderness.  Neurological: She is alert and oriented to person, place, and time. No cranial nerve deficit. She exhibits normal muscle tone. Coordination normal.  No ataxia on finger to nose bilaterally. No pronator drift. 5/5 strength throughout. CN 2-12 intact. Negative Romberg. Equal grip strength. Sensation intact. Gait is normal.   Skin: Skin is warm.  Psychiatric: She has a normal mood and affect. Her behavior is normal.  Nursing note and vitals reviewed.   ED Course  Procedures (including critical care time)  DIAGNOSTIC STUDIES: Oxygen Saturation is 96% on room air, normal by my interpretation.    COORDINATION OF CARE: 11:32 PM Discussed treatment plan with patient at beside, including pain and nausea medications. The patient agrees with the plan and has no further questions at this  time.   Labs Review Labs Reviewed  COMPREHENSIVE METABOLIC PANEL - Abnormal; Notable for the following:    Potassium 3.0 (*)    Glucose, Bld 115 (*)    AST 309 (*)    ALT 159 (*)    Alkaline Phosphatase 165 (*)    GFR calc non Af Amer 63 (*)    GFR calc Af Amer 73 (*)    All other components within normal limits  URINALYSIS, ROUTINE W REFLEX MICROSCOPIC - Abnormal; Notable for the following:    pH 8.5 (*)    All other components within normal limits  COMPREHENSIVE METABOLIC PANEL - Abnormal; Notable for the following:    Potassium 3.0 (*)    Glucose, Bld 150 (*)    Albumin 3.4 (*)    AST 405 (*)  ALT 255 (*)    Alkaline Phosphatase 171 (*)    Total Bilirubin 1.6 (*)    GFR calc non Af Amer 66 (*)    GFR calc Af Amer 76 (*)    All other components within normal limits  CBC - Abnormal; Notable for the following:    Hemoglobin 11.6 (*)    All other components within normal limits  CBC WITH DIFFERENTIAL  LIPASE, BLOOD  TROPONIN I  PROTIME-INR    Imaging Review Ct Abdomen Pelvis W Contrast  05/04/2014   CLINICAL DATA:  Intermittent epigastric abdominal pain for 4 days. Nausea and vomiting. Initial encounter.  EXAM: CT ABDOMEN AND PELVIS WITH CONTRAST  TECHNIQUE: Multidetector CT imaging of the abdomen and pelvis was performed using the standard protocol following bolus administration of intravenous contrast.  CONTRAST:  68m OMNIPAQUE IOHEXOL 300 MG/ML SOLN, 1024mOMNIPAQUE IOHEXOL 300 MG/ML SOLN  COMPARISON:  None.  FINDINGS: Minimal bibasilar atelectasis is noted. Diffuse coronary artery calcifications are seen. Calcification is noted at the mitral valve.  There is dilatation of the common hepatic duct to 1.0 cm, with trace surrounding fluid and inflammation. An obstructing 5 mm stone is noted at the duodenal ampulla, best seen on coronal images. There is associated dilatation of the intrahepatic biliary ducts.  The spleen is unremarkable in appearance. The gallbladder is within  normal limits. The pancreas and adrenal glands are unremarkable.  A 1.1 cm cyst is noted at the lower pole of the left kidney. Nonspecific left-sided perinephric stranding is noted. The kidneys are otherwise unremarkable. There is no evidence of hydronephrosis. No renal or ureteral stones are seen.  No free fluid is identified. The small bowel is unremarkable in appearance. The stomach is within normal limits. No acute vascular abnormalities are seen. Scattered calcification is seen along the abdominal aorta and its branches.  The appendix is normal in caliber, without evidence for appendicitis. Scattered diverticulosis is noted along the proximal sigmoid colon, without evidence of diverticulitis.  The bladder is mildly distended and grossly remarkable in appearance. A small urachal remnant is incidentally seen. The patient is status post hysterectomy. No suspicious adnexal masses are identified. No inguinal lymphadenopathy is seen.  No acute osseous abnormalities are identified. There is mild grade 1 anterolisthesis of L4 on L5, with a right-sided pars defect at L4. Right convex lumbar scoliosis is noted, with mild associated degenerative change.  IMPRESSION: 1. Obstructing 5 mm stone noted at the duodenal ampulla, with associated dilatation of the common hepatic duct to 1.0 cm, and dilatation of the intrahepatic biliary ducts. Trace associated fluid and inflammation noted about the course of the common hepatic duct. 2. Gallbladder remains unremarkable in appearance. 3. Diffuse coronary artery calcifications seen. Calcification at the mitral valve. 4. Small left renal cyst noted. 5. Scattered calcification along the abdominal aorta and its branches. 6. Scattered diverticulosis along the proximal sigmoid colon, without evidence of diverticulitis. 7. Mild grade 1 anterolisthesis of L4 on L5, with a right-sided pars defect at L4. Right convex lumbar scoliosis noted.   Electronically Signed   By: JeGarald Balding.D.    On: 05/04/2014 02:01     EKG Interpretation None      MDM   Final diagnoses:  Abdominal pain  Choledocholithiasis  Transaminitis   Intermittent upper abdominal pain and right upper quadrant pain with nausea.  Labs remarkable for transaminitis. Lipase normal.  CT scan shows obstructing stone and duodenal ampulla. Gallbladder appears normal.  Patient will be admitted for  biliary obstruction and choledocholithiasis. She is afebrile. No leukocytosis. Doubt cholangitis.  Admission d/w Dr. Shanon Brow.   Date: 05/04/2014  Rate: 78  Rhythm: normal sinus rhythm  QRS Axis: normal  Intervals: normal  ST/T Wave abnormalities: normal  Conduction Disutrbances:none  Narrative Interpretation:   Old EKG Reviewed: none available     I personally performed the services described in this documentation, which was scribed in my presence. The recorded information has been reviewed and is accurate.   Ezequiel Essex, MD 05/04/14 [phone]

## 2014-05-04 ENCOUNTER — Encounter (HOSPITAL_COMMUNITY)
Admission: EM | Disposition: A | Discharge status: Home / Self Care | Payer: Self-pay | Source: Home / Self Care | Attending: Internal Medicine

## 2014-05-04 ENCOUNTER — Inpatient Hospital Stay (HOSPITAL_COMMUNITY): Payer: Medicare Other | Admitting: Anesthesiology

## 2014-05-04 ENCOUNTER — Encounter (HOSPITAL_COMMUNITY): Payer: Self-pay | Admitting: Family Medicine

## 2014-05-04 ENCOUNTER — Inpatient Hospital Stay (HOSPITAL_COMMUNITY): Payer: Medicare Other

## 2014-05-04 DIAGNOSIS — K5289 Other specified noninfective gastroenteritis and colitis: Secondary | ICD-10-CM | POA: Diagnosis present

## 2014-05-04 DIAGNOSIS — Z90710 Acquired absence of both cervix and uterus: Secondary | ICD-10-CM | POA: Diagnosis not present

## 2014-05-04 DIAGNOSIS — F17200 Nicotine dependence, unspecified, uncomplicated: Secondary | ICD-10-CM | POA: Diagnosis present

## 2014-05-04 DIAGNOSIS — I10 Essential (primary) hypertension: Secondary | ICD-10-CM | POA: Diagnosis present

## 2014-05-04 DIAGNOSIS — M419 Scoliosis, unspecified: Secondary | ICD-10-CM | POA: Diagnosis present

## 2014-05-04 DIAGNOSIS — Z7982 Long term (current) use of aspirin: Secondary | ICD-10-CM | POA: Diagnosis not present

## 2014-05-04 DIAGNOSIS — Z833 Family history of diabetes mellitus: Secondary | ICD-10-CM | POA: Diagnosis not present

## 2014-05-04 DIAGNOSIS — E7800 Pure hypercholesterolemia, unspecified: Secondary | ICD-10-CM | POA: Diagnosis present

## 2014-05-04 DIAGNOSIS — R740 Nonspecific elevation of levels of transaminase and lactic acid dehydrogenase [LDH]: Secondary | ICD-10-CM

## 2014-05-04 DIAGNOSIS — K219 Gastro-esophageal reflux disease without esophagitis: Secondary | ICD-10-CM | POA: Diagnosis present

## 2014-05-04 DIAGNOSIS — E119 Type 2 diabetes mellitus without complications: Secondary | ICD-10-CM | POA: Diagnosis present

## 2014-05-04 DIAGNOSIS — E785 Hyperlipidemia, unspecified: Secondary | ICD-10-CM | POA: Diagnosis present

## 2014-05-04 DIAGNOSIS — R7401 Elevation of levels of liver transaminase levels: Secondary | ICD-10-CM | POA: Insufficient documentation

## 2014-05-04 DIAGNOSIS — K52839 Microscopic colitis, unspecified: Secondary | ICD-10-CM

## 2014-05-04 DIAGNOSIS — Z23 Encounter for immunization: Secondary | ICD-10-CM | POA: Diagnosis not present

## 2014-05-04 DIAGNOSIS — R1013 Epigastric pain: Secondary | ICD-10-CM | POA: Diagnosis present

## 2014-05-04 DIAGNOSIS — E876 Hypokalemia: Secondary | ICD-10-CM | POA: Diagnosis present

## 2014-05-04 DIAGNOSIS — K8051 Calculus of bile duct without cholangitis or cholecystitis with obstruction: Secondary | ICD-10-CM | POA: Diagnosis present

## 2014-05-04 DIAGNOSIS — K8050 Calculus of bile duct without cholangitis or cholecystitis without obstruction: Secondary | ICD-10-CM

## 2014-05-04 DIAGNOSIS — Z794 Long term (current) use of insulin: Secondary | ICD-10-CM | POA: Diagnosis not present

## 2014-05-04 HISTORY — PX: SPHINCTEROTOMY: SHX5279

## 2014-05-04 HISTORY — PX: ERCP: SHX5425

## 2014-05-04 HISTORY — PX: BALLOON DILATION: SHX5330

## 2014-05-04 HISTORY — PX: REMOVAL OF STONES: SHX5545

## 2014-05-04 LAB — CBC
HCT: 36.5 % (ref 36.0–46.0)
Hemoglobin: 11.6 g/dL — ABNORMAL LOW (ref 12.0–15.0)
MCH: 27.3 pg (ref 26.0–34.0)
MCHC: 31.8 g/dL (ref 30.0–36.0)
MCV: 85.9 fL (ref 78.0–100.0)
PLATELETS: 192 10*3/uL (ref 150–400)
RBC: 4.25 MIL/uL (ref 3.87–5.11)
RDW: 14.4 % (ref 11.5–15.5)
WBC: 6 10*3/uL (ref 4.0–10.5)

## 2014-05-04 LAB — COMPREHENSIVE METABOLIC PANEL
ALT: 159 U/L — AB (ref 0–35)
ALT: 255 U/L — ABNORMAL HIGH (ref 0–35)
ANION GAP: 11 (ref 5–15)
AST: 309 U/L — AB (ref 0–37)
AST: 405 U/L — AB (ref 0–37)
Albumin: 3.9 g/dL (ref 3.5–5.2)
Albumin: 3.4 g/dL — ABNORMAL LOW (ref 3.5–5.2)
Alkaline Phosphatase: 165 U/L — ABNORMAL HIGH (ref 39–117)
Alkaline Phosphatase: 171 U/L — ABNORMAL HIGH (ref 39–117)
Anion gap: 14 (ref 5–15)
BILIRUBIN TOTAL: 1.2 mg/dL (ref 0.3–1.2)
BUN: 14 mg/dL (ref 6–23)
BUN: 10 mg/dL (ref 6–23)
CHLORIDE: 101 meq/L (ref 96–112)
CO2: 31 mEq/L (ref 19–32)
CO2: 32 mEq/L (ref 19–32)
CREATININE: 0.89 mg/dL (ref 0.50–1.10)
Calcium: 9.5 mg/dL (ref 8.4–10.5)
Calcium: 9.1 mg/dL (ref 8.4–10.5)
Chloride: 104 mEq/L (ref 96–112)
Creatinine, Ser: 0.86 mg/dL (ref 0.50–1.10)
GFR calc Af Amer: 73 mL/min — ABNORMAL LOW (ref >90)
GFR calc Af Amer: 76 mL/min — ABNORMAL LOW (ref >90)
GFR calc non Af Amer: 63 mL/min — ABNORMAL LOW (ref >90)
GFR calc non Af Amer: 66 mL/min — ABNORMAL LOW (ref >90)
Glucose, Bld: 115 mg/dL — ABNORMAL HIGH (ref 70–99)
Glucose, Bld: 150 mg/dL — ABNORMAL HIGH (ref 70–99)
Potassium: 3 mEq/L — ABNORMAL LOW (ref 3.7–5.3)
Potassium: 3 mEq/L — ABNORMAL LOW (ref 3.7–5.3)
SODIUM: 146 meq/L (ref 137–147)
Sodium: 147 mEq/L (ref 137–147)
TOTAL PROTEIN: 6.5 g/dL (ref 6.0–8.3)
Total Bilirubin: 1.6 mg/dL — ABNORMAL HIGH (ref 0.3–1.2)
Total Protein: 7.2 g/dL (ref 6.0–8.3)

## 2014-05-04 LAB — POCT I-STAT 4, (NA,K, GLUC, HGB,HCT)
Glucose, Bld: 127 mg/dL — ABNORMAL HIGH (ref 70–99)
HEMATOCRIT: 36 % (ref 36.0–46.0)
HEMOGLOBIN: 12.2 g/dL (ref 12.0–15.0)
Potassium: 3.2 mEq/L — ABNORMAL LOW (ref 3.7–5.3)
SODIUM: 140 meq/L (ref 137–147)

## 2014-05-04 LAB — CBC WITH DIFFERENTIAL/PLATELET
BASOS ABS: 0 10*3/uL (ref 0.0–0.1)
BASOS PCT: 0 % (ref 0–1)
Eosinophils Absolute: 0 10*3/uL (ref 0.0–0.7)
Eosinophils Relative: 0 % (ref 0–5)
HEMATOCRIT: 38.7 % (ref 36.0–46.0)
Hemoglobin: 12.5 g/dL (ref 12.0–15.0)
Lymphocytes Relative: 17 % (ref 12–46)
Lymphs Abs: 1.3 10*3/uL (ref 0.7–4.0)
MCH: 27.7 pg (ref 26.0–34.0)
MCHC: 32.3 g/dL (ref 30.0–36.0)
MCV: 85.6 fL (ref 78.0–100.0)
MONO ABS: 0.6 10*3/uL (ref 0.1–1.0)
Monocytes Relative: 8 % (ref 3–12)
NEUTROS ABS: 5.5 10*3/uL (ref 1.7–7.7)
NEUTROS PCT: 75 % (ref 43–77)
PLATELETS: 216 10*3/uL (ref 150–400)
RBC: 4.52 MIL/uL (ref 3.87–5.11)
RDW: 14.3 % (ref 11.5–15.5)
WBC: 7.3 10*3/uL (ref 4.0–10.5)

## 2014-05-04 LAB — GLUCOSE, CAPILLARY
GLUCOSE-CAPILLARY: 215 mg/dL — AB (ref 70–99)
GLUCOSE-CAPILLARY: 86 mg/dL (ref 70–99)
Glucose-Capillary: 119 mg/dL — ABNORMAL HIGH (ref 70–99)
Glucose-Capillary: 167 mg/dL — ABNORMAL HIGH (ref 70–99)

## 2014-05-04 LAB — URINALYSIS, ROUTINE W REFLEX MICROSCOPIC
Bilirubin Urine: NEGATIVE
Glucose, UA: NEGATIVE mg/dL
Hgb urine dipstick: NEGATIVE
Ketones, ur: NEGATIVE mg/dL
LEUKOCYTES UA: NEGATIVE
NITRITE: NEGATIVE
Protein, ur: NEGATIVE mg/dL
SPECIFIC GRAVITY, URINE: 1.015 (ref 1.005–1.030)
Urobilinogen, UA: 1 mg/dL (ref 0.0–1.0)
pH: 8.5 — ABNORMAL HIGH (ref 5.0–8.0)

## 2014-05-04 LAB — LIPASE, BLOOD: LIPASE: 41 U/L (ref 11–59)

## 2014-05-04 LAB — TROPONIN I: Troponin I: <0.3 ng/mL (ref <0.30)

## 2014-05-04 LAB — PROTIME-INR
INR: 1.12 (ref 0.00–1.49)
Prothrombin Time: 14.5 seconds (ref 11.6–15.2)

## 2014-05-04 SURGERY — ERCP, WITH INTERVENTION IF INDICATED
Anesthesia: General

## 2014-05-04 MED ORDER — MORPHINE SULFATE 2 MG/ML IJ SOLN
2.0000 mg | INTRAMUSCULAR | Status: DC | PRN
  Administered 2014-05-04: 2 mg via INTRAVENOUS
  Filled 2014-05-04 (×2): qty 1

## 2014-05-04 MED ORDER — LORAZEPAM 2 MG/ML IJ SOLN
0.5000 mg | Freq: Every evening | INTRAMUSCULAR | Status: DC | PRN
Start: 2014-05-04 — End: 2014-05-05

## 2014-05-04 MED ORDER — DEXTROSE 5 % IV SOLN
INTRAVENOUS | Status: AC
  Filled 2014-05-04: qty 10

## 2014-05-04 MED ORDER — GLUCAGON HCL RDNA (DIAGNOSTIC) 1 MG IJ SOLR
INTRAMUSCULAR | Status: DC | PRN
  Administered 2014-05-04 (×2): 0.25 mg via INTRAVENOUS

## 2014-05-04 MED ORDER — EPHEDRINE SULFATE 50 MG/ML IJ SOLN
INTRAMUSCULAR | Status: DC | PRN
  Administered 2014-05-04 (×2): 10 mg via INTRAVENOUS

## 2014-05-04 MED ORDER — ROCURONIUM BROMIDE 100 MG/10ML IV SOLN
INTRAVENOUS | Status: DC | PRN
  Administered 2014-05-04: 5 mg via INTRAVENOUS

## 2014-05-04 MED ORDER — ONDANSETRON HCL 4 MG PO TABS: 4.0000 mg | ORAL_TABLET | Freq: Four times a day (QID) | ORAL | Status: DC | PRN

## 2014-05-04 MED ORDER — LIDOCAINE HCL 1 % IJ SOLN
INTRAMUSCULAR | Status: DC | PRN
  Administered 2014-05-04: 40 mg via INTRADERMAL

## 2014-05-04 MED ORDER — HYDROCORTISONE NA SUCCINATE PF 100 MG IJ SOLR
50.0000 mg | Freq: Two times a day (BID) | INTRAMUSCULAR | Status: DC
  Administered 2014-05-04 – 2014-05-05 (×2): 50 mg via INTRAVENOUS
  Filled 2014-05-04 (×2): qty 2

## 2014-05-04 MED ORDER — LACTATED RINGERS IV SOLN
INTRAVENOUS | Status: DC | PRN
  Administered 2014-05-04: 11:00:00 via INTRAVENOUS

## 2014-05-04 MED ORDER — MIDAZOLAM HCL 5 MG/5ML IJ SOLN
INTRAMUSCULAR | Status: DC | PRN
  Administered 2014-05-04: 2 mg via INTRAVENOUS

## 2014-05-04 MED ORDER — INSULIN ASPART 100 UNIT/ML ~~LOC~~ SOLN
0.0000 [IU] | SUBCUTANEOUS | Status: DC
Start: 2014-05-04 — End: 2014-05-04
  Administered 2014-05-04: 2 [IU] via SUBCUTANEOUS

## 2014-05-04 MED ORDER — SODIUM CHLORIDE 0.9 % IV SOLN
INTRAVENOUS | Status: DC
Start: 2014-05-04 — End: 2014-05-04

## 2014-05-04 MED ORDER — SODIUM CHLORIDE 0.9 % IV SOLN: INTRAVENOUS | Status: DC

## 2014-05-04 MED ORDER — CEFTRIAXONE SODIUM IN DEXTROSE 20 MG/ML IV SOLN
1.0000 g | Freq: Once | INTRAVENOUS | Status: AC
  Administered 2014-05-04: 1 g via INTRAVENOUS
  Filled 2014-05-04: qty 50

## 2014-05-04 MED ORDER — ONDANSETRON HCL 4 MG/2ML IJ SOLN: 4.0000 mg | Freq: Four times a day (QID) | INTRAMUSCULAR | Status: DC | PRN

## 2014-05-04 MED ORDER — IOHEXOL 300 MG/ML  SOLN
50.0000 mL | Freq: Once | INTRAMUSCULAR | Status: AC | PRN
  Administered 2014-05-04: 50 mL via ORAL

## 2014-05-04 MED ORDER — SODIUM CHLORIDE 0.9 % IV SOLN
INTRAVENOUS | Status: DC | PRN
  Administered 2014-05-04: 50 mL

## 2014-05-04 MED ORDER — POTASSIUM CHLORIDE 10 MEQ/100ML IV SOLN
INTRAVENOUS | Status: AC
  Administered 2014-05-04: 10 meq via INTRAVENOUS
  Filled 2014-05-04: qty 100

## 2014-05-04 MED ORDER — ONDANSETRON HCL 4 MG/2ML IJ SOLN
INTRAMUSCULAR | Status: DC | PRN
  Administered 2014-05-04: 4 mg via INTRAVENOUS

## 2014-05-04 MED ORDER — IOHEXOL 300 MG/ML  SOLN
100.0000 mL | Freq: Once | INTRAMUSCULAR | Status: AC | PRN
  Administered 2014-05-04: 100 mL via INTRAVENOUS

## 2014-05-04 MED ORDER — FENTANYL CITRATE 0.05 MG/ML IJ SOLN
INTRAMUSCULAR | Status: DC | PRN
  Administered 2014-05-04 (×2): 50 ug via INTRAVENOUS

## 2014-05-04 MED ORDER — INSULIN ASPART 100 UNIT/ML ~~LOC~~ SOLN
0.0000 [IU] | Freq: Three times a day (TID) | SUBCUTANEOUS | Status: DC
Start: 2014-05-04 — End: 2014-05-05
  Administered 2014-05-05: 3 [IU] via SUBCUTANEOUS

## 2014-05-04 MED ORDER — SUCCINYLCHOLINE CHLORIDE 20 MG/ML IJ SOLN
INTRAMUSCULAR | Status: DC | PRN
Start: 2014-05-04 — End: 2014-05-04
  Administered 2014-05-04: 100 mg via INTRAVENOUS

## 2014-05-04 MED ORDER — METOPROLOL TARTRATE 1 MG/ML IV SOLN
2.5000 mg | Freq: Four times a day (QID) | INTRAVENOUS | Status: DC
  Administered 2014-05-04 – 2014-05-05 (×5): 2.5 mg via INTRAVENOUS
  Filled 2014-05-04 (×4): qty 5

## 2014-05-04 MED ORDER — POTASSIUM CHLORIDE 10 MEQ/100ML IV SOLN
10.0000 meq | INTRAVENOUS | Status: AC
  Administered 2014-05-04 (×4): 10 meq via INTRAVENOUS
  Filled 2014-05-04: qty 100

## 2014-05-04 MED ORDER — INSULIN ASPART 100 UNIT/ML ~~LOC~~ SOLN: 0.0000 [IU] | Freq: Three times a day (TID) | SUBCUTANEOUS | Status: DC

## 2014-05-04 MED ORDER — STERILE WATER FOR IRRIGATION IR SOLN
Status: DC | PRN
  Administered 2014-05-04: 1000 mL

## 2014-05-04 MED ORDER — PANTOPRAZOLE SODIUM 40 MG IV SOLR
40.0000 mg | INTRAVENOUS | Status: DC
  Administered 2014-05-04 – 2014-05-05 (×2): 40 mg via INTRAVENOUS
  Filled 2014-05-04: qty 40

## 2014-05-04 MED ORDER — POTASSIUM CHLORIDE IN NACL 20-0.9 MEQ/L-% IV SOLN
INTRAVENOUS | Status: AC
  Administered 2014-05-04: 04:00:00 via INTRAVENOUS

## 2014-05-04 MED ORDER — INSULIN ASPART 100 UNIT/ML ~~LOC~~ SOLN: 0.0000 [IU] | Freq: Every day | SUBCUTANEOUS | Status: DC

## 2014-05-04 MED ORDER — PROPOFOL 10 MG/ML IV BOLUS
INTRAVENOUS | Status: DC | PRN
  Administered 2014-05-04: 120 mg via INTRAVENOUS

## 2014-05-04 MED ORDER — MORPHINE SULFATE 4 MG/ML IJ SOLN
4.0000 mg | Freq: Once | INTRAMUSCULAR | Status: AC
  Administered 2014-05-04: 4 mg via INTRAVENOUS

## 2014-05-04 MED ORDER — POTASSIUM CHLORIDE CRYS ER 20 MEQ PO TBCR: 40.0000 meq | EXTENDED_RELEASE_TABLET | Freq: Once | ORAL | Status: DC

## 2014-05-04 SURGICAL SUPPLY — 27 items
BAG HAMPER (MISCELLANEOUS) ×3 IMPLANT
BALLN RETRIEVAL 12X15 (BALLOONS) IMPLANT
BALLN RETRIEVAL 12X15MM (BALLOONS)
BALN RTRVL 200 6-7FR 12-15 (BALLOONS)
BASKET TRAPEZOID 3X6 (MISCELLANEOUS) IMPLANT
BSKT STON RTRVL TRAPEZOID 3X6 (MISCELLANEOUS)
DEVICE INFLATION ENCORE 26 (MISCELLANEOUS) IMPLANT
DEVICE LOCKING W-BIOPSY CAP (MISCELLANEOUS) ×1 IMPLANT
GUIDEWIRE HYDRA JAGWIRE .35 (WIRE) ×2 IMPLANT
GUIDEWIRE JAG HINI 025X260CM (WIRE) IMPLANT
KIT CLEAN ENDO COMPLIANCE (KITS) ×3 IMPLANT
KIT ROOM TURNOVER APOR (KITS) ×3 IMPLANT
LUBRICANT JELLY 4.5OZ STERILE (MISCELLANEOUS) ×2 IMPLANT
PAD ARMBOARD 7.5X6 YLW CONV (MISCELLANEOUS) ×5 IMPLANT
PATHFINDER 450CM 0.18 (STENTS) IMPLANT
POSITIONER HEAD 8X9X4 ADT (SOFTGOODS) IMPLANT
SCOPE SPY DS DISPOSABLE (MISCELLANEOUS) ×1 IMPLANT
SNARE ROTATE MED OVAL 20MM (MISCELLANEOUS) IMPLANT
SNARE SHORT THROW 13M SML OVAL (MISCELLANEOUS) IMPLANT
SPHINCTEROTOME AUTOTOME .25 (MISCELLANEOUS) ×2 IMPLANT
SPHINCTEROTOME HYDRATOME 44 (MISCELLANEOUS) ×3 IMPLANT
STENT PANCREATIC PIGTAIL 4FX5 (Stent) ×2 IMPLANT
SYR 50ML LL SCALE MARK (SYRINGE) ×4 IMPLANT
SYSTEM CONTINUOUS INJECTION (MISCELLANEOUS) ×3 IMPLANT
WALLSTENT METAL COVERED 10X60 (STENTS) IMPLANT
WALLSTENT METAL COVERED 10X80 (STENTS) IMPLANT
WATER STERILE IRR 1000ML POUR (IV SOLUTION) ×3 IMPLANT

## 2014-05-04 NOTE — Progress Notes (Signed)
IV fluids infusing at [facility] rate due to potassium riders. Patient c/o pain at site. No redness or swelling noted and blood return noted. Potassium rider infusion rate adjusted. No further complaint.

## 2014-05-04 NOTE — Transfer of Care (Signed)
Immediate Anesthesia Transfer of Care Note  Patient: [name]  Procedure(s) Performed: Procedure(s): ENDOSCOPIC RETROGRADE CHOLANGIOPANCREATOGRAPHY (ERCP) (N/A) SPHINCTEROTOMY (N/A) REMOVAL OF STONES (N/A) BALLOON DILATION (N/A)  Patient Location: PACU  Anesthesia Type:General  Level of Consciousness: awake  Airway & Oxygen Therapy: Patient Spontanous Breathing and Patient connected to face mask oxygen  Post-op Assessment: Report given to PACU RN  Post vital signs: Reviewed and stable  Complications: No apparent anesthesia complications

## 2014-05-04 NOTE — Progress Notes (Signed)
Patient returned from Endo via stretcher. Alert and oriented. Family at bedside. Oxygen in use via nasal cannula.

## 2014-05-04 NOTE — Progress Notes (Addendum)
[facility] Progress Note   Diane Hawkins VOH:[id number] DOB: 10/04/1940 DOA: 05/03/2014 PCP: Clinton Quant, MD  Brief narrative: Diane Hawkins is a 73 y.o. female  retired retired [employer] scientist with a past medical history of insulin-requiring diabetes mellitus, hypertension, hypercholesterolemia, gastroesophageal reflux disease, and back pain with a recent diagnosis of microscopic colitis on steroids. The patient presents with a complaint of epigastric pain. She has been noticing pain in the upper abdomen for about 4 days now starting after lunch. She does not eat breakfast. She states that the pain usually fades away but last night was quite intense and did not go away. She had some mild nausea but no vomiting she has not had any diarrhea. She has not had any fevers or chills.   Subjective: Currently pain is under control-she states that her last dose of morphine was at 3 AM.  Assessment/Plan: Principal Problem:   Choledocholithiasis -GI consult has been requested- no associated cholecystitis noted on CT scan -LFTs are increasing - will request a surgical consult as well today -Continue to keep nothing by mouth with supplemental IV fluids for hydration -Hold off on anticoagulation for DVT prophylaxis in case a procedure is needed today.  Active Problems:   Diabetes mellitus -Sugars are stable at this point-will place on a low-dose sliding scale -it is to be noted that she has not taken her steroids and over 24 hours and I am now placing her on IV steroids which may cause her sugars to rise  Microscopic colitis -Recently diagnosed - symptoms of diarrhea have resolved on treatment-she states the plan is to keep her on budesonide for a total of 2 months-will place on IV hydrocortisone for now-  Hypokalemia - replaced via IV- KCL in continuous IV fluids as well    Hypertension -Takes carvedilol and amlodipine at home-we'll place on IV Lopressor for now    GERD  (gastroesophageal reflux disease) -Takes Nexium at home-will start IV Protonix    Hypercholesterolemia -Hold statin   Code Status: Full code Family Communication: Husband Disposition Plan: home once stable DVT prophylaxis: SCDs  Consultants: GI  Procedures: none  Antibiotics: Anti-infectives    None         Objective: Filed Weights   05/03/14 2227 05/04/14 0348  Weight: 68.947 kg (152 lb) 66.996 kg (147 lb 11.2 oz)    Intake/Output Summary (Last 24 hours) at 05/04/14 0842 Last data filed at 05/04/14 0326  Gross per 24 hour  Intake      0 ml  Output      1 ml  Net     -1 ml     Vitals Filed Vitals:   05/04/14 0130 05/04/14 0242 05/04/14 0348 05/04/14 0615  BP: 150/73 135/81 151/70 141/56  Pulse:  82 95 77  Temp:   98.2 F (36.8 C) 98.4 F (36.9 C)  TempSrc:   Oral Oral  Resp: 27 24 20 20   Height:      Weight:   66.996 kg (147 lb 11.2 oz)   SpO2:  93% 100% 92%    Exam: General: No acute respiratory distress Lungs: Clear to auscultation bilaterally without wheezes or crackles Cardiovascular: Regular rate and rhythm without murmur gallop or rub normal S1 and S2 Abdomen:Mild tenderness in the epigastrium with less tenderness and right upper quadrant, nondistended, soft, bowel sounds positive, no rebound, no ascites, no appreciable mass Extremities: No significant cyanosis, clubbing, or edema bilateral lower extremities  Data Reviewed: Basic Metabolic Panel:  Recent Labs  Lab 05/04/14 0011 05/04/14 0547  NA 146 147  K 3.0* 3.0*  CL 101 104  CO2 31 32  GLUCOSE 115* 150*  BUN 14 10  CREATININE 0.89 0.86  CALCIUM 9.5 9.1   Liver Function Tests:  Recent Labs Lab 05/04/14 0011 05/04/14 0547  AST 309* 405*  ALT 159* 255*  ALKPHOS 165* 171*  BILITOT 1.2 1.6*  PROT 7.2 6.5  ALBUMIN 3.9 3.4*    Recent Labs Lab 05/04/14 0011  LIPASE 41   No results for input(s): AMMONIA in the last 168 hours. CBC:  Recent Labs Lab 05/04/14 0011  05/04/14 0547  WBC 7.3 6.0  NEUTROABS 5.5  --   HGB 12.5 11.6*  HCT 38.7 36.5  MCV 85.6 85.9  PLT 216 192   Cardiac Enzymes:  Recent Labs Lab 05/04/14 0011  TROPONINI <0.30   BNP (last 3 results) No results for input(s): PROBNP in the last 8760 hours. CBG:  Recent Labs Lab 05/04/14 0826  GLUCAP 119*    No results found for this or any previous visit (from the past 240 hour(s)).   Studies:  Recent x-ray studies have been reviewed in detail by the Attending Physician  Scheduled Meds:  Scheduled Meds: . hydrocortisone sod succinate (SOLU-CORTEF) inj  50 mg Intravenous Q12H  . insulin aspart  0-5 Units Subcutaneous 6 times per day  . metoprolol  2.5 mg Intravenous 4 times per day  . pantoprazole (PROTONIX) IV  40 mg Intravenous Q24H  . potassium chloride  10 mEq Intravenous Q1 Hr x 4   Continuous Infusions: . 0.9 % NaCl with KCl 20 mEq / L 100 mL/hr at 05/04/14 0407    Time spent on care of this patient: 71 min   Pablo, MD 05/04/2014, 8:42 AM  LOS: 1 day   [facility] Office  [phone] Pager - Text Page per [email or web address]  If 7PM-7AM, please contact night-coverage [email or web address]

## 2014-05-04 NOTE — Anesthesia Preprocedure Evaluation (Addendum)
Anesthesia Evaluation   Patient awake    Reviewed: Allergy & Precautions, H&P , NPO status , Patient's Chart, lab work & pertinent test results, reviewed documented beta blocker date and time   History of Anesthesia Complications Negative for: history of anesthetic complications  Airway       Comment: 05/04/2014  Was difficult to view posterior arytenoids with mac 3, would recommend miller blade or possibly video laryngoscopy. Dental   Pulmonary Current Smoker,          Cardiovascular Exercise Tolerance: Good hypertension, Pt. on medications and Pt. on home beta blockers  Coreg this am   Neuro/Psych    GI/Hepatic Neg liver ROS, GERD-  Medicated and Controlled,  Endo/Other  diabetes, Well Controlled, Type 1  Renal/GU negative Renal ROS     Musculoskeletal   Abdominal   Peds  Hematology   Anesthesia Other Findings   Reproductive/Obstetrics                         Anesthesia Physical Anesthesia Plan  ASA: II and emergent  Anesthesia Plan: General   Post-op Pain Management:    Induction: Intravenous, Rapid sequence and Cricoid pressure planned  Airway Management Planned: Oral ETT  Additional Equipment:   Intra-op Plan:   Post-operative Plan: Extubation in OR  Informed Consent:   Plan Discussed with: Anesthesiologist  Anesthesia Plan Comments: (Case discussed with Dr. Patsey Berthold via telephone.  Agreeable to GOT/RSI.)       Anesthesia Quick Evaluation

## 2014-05-04 NOTE — H&P (Signed)
PCP:   Clinton Quant, MD   Chief Complaint:  abd pain  HPI: 73 yo female comes in with 4 days of severe epigastric abdominal pain that has progressively worsened especially today.  It has been worse after lunchtime.  With associated nausea minimal vomiting.  No diarrhea.  No fevers.  Still has gallbladder.  Usually the pain would improve with nothing, but today it lasted for well over an hour and would not let up so she came to the ER.  Her pain is much better after given morphine in the ED.  Review of Systems:  Positive and negative as per HPI otherwise all other systems are negative  Past Medical History: Past Medical History  Diagnosis Date  . Diabetes   . Hypertension   . Hypercholesterolemia   . GERD (gastroesophageal reflux disease)   . Back pain   . Scoliosis    Past Surgical History  Procedure Laterality Date  . Back surgery      spinal stenosis  . Colonoscopy  Oct 2012    Dr. Algis Greenhouse: few small rectal polyps s/p biopsy, adenomatous polyps  . Colonoscopy N/A 04/09/2014    Procedure: COLONOSCOPY;  Surgeon: Daneil Dolin, MD;  Location: [facility];  Service: Endoscopy;  Laterality: N/A;  200  . Abdominal hysterectomy      Medications: Prior to Admission medications   Medication Sig Start Date End Date Taking? Authorizing Provider  acetaminophen (TYLENOL) 500 MG tablet Take 500 mg by mouth every 6 (six) hours as needed for headache.     Historical Provider, MD  amLODipine (NORVASC) 5 MG tablet Take 5 mg by mouth daily.    Historical Provider, MD  aspirin 500 MG tablet Take 500 mg by mouth daily.    Historical Provider, MD  Budesonide (UCERIS) 9 MG TB24 Take 1 capsule by mouth daily. 04/19/14   Orvil Feil, NP  carvedilol (COREG) 12.5 MG tablet Take 12.5 mg by mouth 2 (two) times daily.    Historical Provider, MD  Cholecalciferol (VITAMIN D PO) Take 1,000 Units by mouth.    Historical Provider, MD  Esomeprazole Magnesium (NEXIUM PO) Take 1 capsule by mouth  daily.     Historical Provider, MD  Insulin Aspart Prot & Aspart (NOVOLOG MIX 70/30 [place]) Inject into the skin. 25 units in the AM 10 units in the PM    Historical Provider, MD  Loperamide HCl (IMODIUM PO) Take 1 tablet by mouth daily as needed (diarrhea).     Historical Provider, MD  LORazepam (ATIVAN) 0.5 MG tablet Take 0.5 mg by mouth daily as needed for anxiety.     Historical Provider, MD  peg 3350 powder (MOVIPREP) 100 G SOLR Take 1 kit (200 g total) by mouth as directed. 04/02/14   Daneil Dolin, MD  Probiotic Product (RESTORA PO) Take 1 capsule by mouth daily.    Historical Provider, MD  rosuvastatin (CRESTOR) 10 MG tablet Take 10 mg by mouth daily.    Historical Provider, MD    Allergies:   Allergies  Allergen Reactions  . Codeine     Patient stated that she does not like taking any codeine   . Erythromycin Nausea And Vomiting    Social History:  reports that she has been smoking E-cigarettes.  She has been smoking about 0.00 packs per day. She does not have any smokeless tobacco history on file. She reports that she does not drink alcohol or use illicit drugs.  Family History: Family History  Problem Relation Age of Onset  . Colon cancer Neg Hx   . Diverticulitis Mother   . Ulcers Mother     Physical Exam: Filed Vitals:   05/03/14 2330 05/04/14 0000 05/04/14 0030 05/04/14 0100  BP: 164/66 160/78 155/57 154/60  Pulse: 85 85 81   Temp:      TempSrc:      Resp: _0 Height:      Weight:      SpO2: 97% 95% 97%    General appearance: alert, cooperative and no distress Head: Normocephalic, without obvious abnormality, atraumatic Eyes: negative Nose: Nares normal. Septum midline. Mucosa normal. No drainage or sinus tenderness. Neck: no JVD and supple, symmetrical, trachea midline Lungs: clear to auscultation bilaterally Heart: regular rate and rhythm, S1, S2 normal, no murmur, click, rub or gallop Abdomen: soft, non-tender; bowel sounds normal; no masses,  no  organomegaly Extremities: extremities normal, atraumatic, no cyanosis or edema Pulses: 2+ and symmetric Skin: Skin color, texture, turgor normal. No rashes or lesions Neurologic: Grossly normal  Labs on Admission:   Recent Labs  05/04/14 0011  NA 146  K 3.0*  CL 101  CO2 31  GLUCOSE 115*  BUN 14  CREATININE 0.89  CALCIUM 9.5    Recent Labs  05/04/14 0011  AST 309*  ALT 159*  ALKPHOS 165*  BILITOT 1.2  PROT 7.2  ALBUMIN 3.9    Recent Labs  05/04/14 0011  LIPASE 41    Recent Labs  05/04/14 0011  WBC 7.3  NEUTROABS 5.5  HGB 12.5  HCT 38.7  MCV 85.6  PLT 216    Recent Labs  05/04/14 0011  TROPONINI <0.30   Radiological Exams on Admission: Ct Abdomen Pelvis W Contrast  05/04/2014   CLINICAL DATA:  Intermittent epigastric abdominal pain for 4 days. Nausea and vomiting. Initial encounter.  EXAM: CT ABDOMEN AND PELVIS WITH CONTRAST  TECHNIQUE: Multidetector CT imaging of the abdomen and pelvis was performed using the standard protocol following bolus administration of intravenous contrast.  CONTRAST:  73m OMNIPAQUE IOHEXOL 300 MG/ML SOLN, 1089mOMNIPAQUE IOHEXOL 300 MG/ML SOLN  COMPARISON:  None.  FINDINGS: Minimal bibasilar atelectasis is noted. Diffuse coronary artery calcifications are seen. Calcification is noted at the mitral valve.  There is dilatation of the common hepatic duct to 1.0 cm, with trace surrounding fluid and inflammation. An obstructing 5 mm stone is noted at the duodenal ampulla, best seen on coronal images. There is associated dilatation of the intrahepatic biliary ducts.  The spleen is unremarkable in appearance. The gallbladder is within normal limits. The pancreas and adrenal glands are unremarkable.  A 1.1 cm cyst is noted at the lower pole of the left kidney. Nonspecific left-sided perinephric stranding is noted. The kidneys are otherwise unremarkable. There is no evidence of hydronephrosis. No renal or ureteral stones are seen.  No free  fluid is identified. The small bowel is unremarkable in appearance. The stomach is within normal limits. No acute vascular abnormalities are seen. Scattered calcification is seen along the abdominal aorta and its branches.  The appendix is normal in caliber, without evidence for appendicitis. Scattered diverticulosis is noted along the proximal sigmoid colon, without evidence of diverticulitis.  The bladder is mildly distended and grossly remarkable in appearance. A small urachal remnant is incidentally seen. The patient is status post hysterectomy. No suspicious adnexal masses are identified. No inguinal lymphadenopathy is seen.  No acute osseous abnormalities are identified. There is mild grade 1 anterolisthesis of  L4 on L5, with a right-sided pars defect at L4. Right convex lumbar scoliosis is noted, with mild associated degenerative change.  IMPRESSION: 1. Obstructing 5 mm stone noted at the duodenal ampulla, with associated dilatation of the common hepatic duct to 1.0 cm, and dilatation of the intrahepatic biliary ducts. Trace associated fluid and inflammation noted about the course of the common hepatic duct. 2. Gallbladder remains unremarkable in appearance. 3. Diffuse coronary artery calcifications seen. Calcification at the mitral valve. 4. Small left renal cyst noted. 5. Scattered calcification along the abdominal aorta and its branches. 6. Scattered diverticulosis along the proximal sigmoid colon, without evidence of diverticulitis. 7. Mild grade 1 anterolisthesis of L4 on L5, with a right-sided pars defect at L4. Right convex lumbar scoliosis noted.   Electronically Signed   By: Garald Balding M.D.   On: 05/04/2014 02:01    Assessment/Plan  73 yo female with choledocholithiasis without signs of cholecystitis or cholangitis  Principal Problem:   Choledocholithiasis-  Admit to medical bed.  Appears nontoxic and abd exam is benign.  Keep npo and consult gi for possible ercp.  Will need eventual  surgical evaluation for cholecystectomy which could be done on elective outpt basis unless her status worsens.  Hold anticoagulants at this time due to potential procedure in am with GI.    Active Problems:  Stable unless o/w   Diabetes mellitus   Hypertension   Abdominal pain, acute, epigastric   GERD (gastroesophageal reflux disease)   Hypercholesterolemia  Admit to med surg.  Full code.  Arlethia Basso A 05/04/2014, 2:35 AM

## 2014-05-04 NOTE — Progress Notes (Signed)
Patient taken to endo via stretcher. Dr. Laural Golden in to see patient. Family at bedside

## 2014-05-04 NOTE — Op Note (Signed)
ERCP PROCEDURE REPORT  PATIENT:  Diane Hawkins  MR#:  [id number] Birthdate:  01-Aug-1940, 73 y.o., female Endoscopist:  Dr. Rogene Houston, MD Referred By:  Dr. Alease Medina, MD  Procedure Date: 05/04/2014  Procedure:   ERCP with sphincterotomy and stone extraction and pancreatic stent placement.    Indications:  Patient is 73 year old Caucasian female who presents with abdominal pain and on workup noted to have common duct stone.            Informed Consent:  The risks, benefits, limitations, alternatives, and mponderable have been reviewed with the patient. I specifically discussed a 1 in 10 chance of pancreatitis, reaction to medications, bleeding, perforation and the possibility of a failed ERCP. Potential for sphincterotomy and stent placement also reviewed. Questions have been answered. All parties agreeable.  Please see history & physical in medical record for more information.  Medications:  Gen. endotracheal anesthesia. *Please see anesthesia record for complete details  Description of procedure:  Procedure performed in the OR. The patient was placed under anesthesia, intubated, and turned into semipermanent position. Therapeutic Pentax video duodenoscope passed through the oropharynx without any difficulty into the esophagus, stomach, and across the pylorus and pull, and descending duodenum.  Cannulation was attempted with RX 44 autotome an 035 hydrajagwire. Q scope was passed at end of procedure to decompress stomach of gastric contents.  Findings:  Stomach was full of liquid and food debris. It was partially decompressed with duodenoscope and with Q scope once ERCP was completed. Very small ampullary orifice with edema to mucosa and long intramural segment. PD was initially cannulated and partially filled with contrast no abnormality noted. Guidewire was left in place and used to place stent as below. CBD selectively cannulated CBD and CHD mildly dilated with single filling  defect. Cystic duct was patent. Biliary sphincterotomy performed and single pigmented stone removed with stone balloon extractor. 4 French 5 cm long single pigtail stent placed into pancreatic duct over the guidewire for prophylaxis. Once the due to no scope was removed, large-channel Q scope was passed in the stomach and most of the gastric contents were removed.  Therapeutic/Diagnostic Maneuvers Performed:  See above  Complications:  None  Impression:  Gastric contents removed with the due to no scope and a Q scope. Edema to mucosa at ampulla with very small orifice. Mildly dilated CBD and CHD with single stone which was removed following sphincterotomy using balloon stone extractor. 4 French 5 cm long pigtail stent placement pancreatic duct for prophylactic purposes.  Recommendations:  Clear liquids today. Repeat lab in a.m. including serum amylase. No aspirin or anticoagulants for 72 hours. Surgical consultation as planned.  Diane Hawkins  05/04/2014  12:29 PM  CC: Dr. Clinton Quant, MD & Dr. Rayne Du ref. provider found

## 2014-05-04 NOTE — Anesthesia Procedure Notes (Addendum)
Procedure Name: Intubation Date/Time: 05/04/2014 11:18 AM Performed by: Tressie Stalker E Pre-anesthesia Checklist: Patient identified, Patient being monitored, Timeout performed, Emergency Drugs available and Suction available Patient Re-evaluated:Patient Re-evaluated prior to inductionOxygen Delivery Method: Circle System Utilized Preoxygenation: Pre-oxygenation with 100% oxygen Intubation Type: IV induction, Rapid sequence and Cricoid Pressure applied Ventilation: Mask ventilation without difficulty Laryngoscope Size: Mac and 3 Grade View: Grade II Tube type: Oral Tube size: 7.0 mm Number of attempts: 1 Airway Equipment and Method: stylet Placement Confirmation: ETT inserted through vocal cords under direct vision,  positive ETCO2 and breath sounds checked- equal and bilateral Secured at: 21 cm Tube secured with: Tape Dental Injury: Teeth and Oropharynx as per pre-operative assessment  Comments: Was difficult to visualize posterior arytenoids with mac 3, would recommend miller blade or possibly video laryngoscopy.

## 2014-05-04 NOTE — Consult Note (Signed)
Referring Provider: Debbe Odea, MD Primary Care Physician:  Clinton Quant, MD Primary Gastroenterologist:  Dr. Laural Golden  Reason for Consultation:    Choledocholithiasis.  HPI:   Patient is 73 year old Caucasian female who sister contacted me around 9 PM last night the patient was having severe epigastric pain. Pain started around 6 PM and she did not get relief with antacid like she had 3 nights before. Patient thought that she was having side effects from budesonide that she'd been taking for last 2 weeks for lymphocytic colitis. She was informed that her symptoms were not due to the medication and that she should come to emergency room. On evaluation in emergency room she was found to have elevated transaminases and bilirubin. Abdominopelvic CT was obtained and reveal dilated CBD measuring 10 mm with a single stone distally. No stones were identified in gallbladder. Pancreas was unremarkable. Patient was hospitalized for pain control and further management. Patient states that she had high midepigastric pain for nights in a row. Pain occurred after evening meal and first 3 episodes were relieved with antacid not this episode. She felt nauseated but did not throw up. She denies fever chills diarrhea melena or rectal bleeding. There is no radiation of this pain to other areas. Patient states her diarrhea has resolved since she has been on budesonide. She has good appetite and there is no history of recent weight loss. She takes [facility] powder usually once a day for back pain. Last dose was yesterday morning. Patient does not drink alcohol. She is smokes one or 2 cigars a day. Family history significant for diabetes mellitus. Her mother was diabetic and so was her grandparent on father's side. Her son and sister are both diabetic. She is a retired Therapist, sports.  Past Medical History  Diagnosis Date  . Diabetes   . Hypertension   . Hypercholesterolemia   . GERD (gastroesophageal reflux  disease)   . Back pain   . Scoliosis     Past Surgical History  Procedure Laterality Date  . Back surgery      spinal stenosis  . Colonoscopy  Oct 2012    Dr. Algis Greenhouse: few small rectal polyps s/p biopsy, adenomatous polyps  . Colonoscopy N/A 04/09/2014    Procedure: COLONOSCOPY;  Surgeon: Daneil Dolin, MD;  Location: [facility];  Service: Endoscopy;  Laterality: N/A;  200  . Abdominal hysterectomy      Prior to Admission medications   Medication Sig Start Date End Date Taking? Authorizing Provider  acetaminophen (TYLENOL) 500 MG tablet Take 500 mg by mouth every 6 (six) hours as needed for headache.    Yes Historical Provider, MD  amLODipine (NORVASC) 5 MG tablet Take 5 mg by mouth daily.   Yes Historical Provider, MD  aspirin 500 MG tablet Take 500 mg by mouth daily.   Yes Historical Provider, MD  Budesonide (UCERIS) 9 MG TB24 Take 1 capsule by mouth daily. 04/19/14  Yes Orvil Feil, NP  carvedilol (COREG) 12.5 MG tablet Take 12.5 mg by mouth 2 (two) times daily.   Yes Historical Provider, MD  Cholecalciferol (VITAMIN D PO) Take 1,000 Units by mouth.   Yes Historical Provider, MD  Esomeprazole Magnesium (NEXIUM PO) Take 1 capsule by mouth daily.    Yes Historical Provider, MD  Insulin Aspart Prot & Aspart (NOVOLOG MIX 70/30 [place]) Inject into the skin. 25 units in the AM 10 units in the PM   Yes Historical Provider, MD  Loperamide HCl (IMODIUM PO)  Take 1 tablet by mouth daily as needed (diarrhea).    Yes Historical Provider, MD  LORazepam (ATIVAN) 0.5 MG tablet Take 0.5 mg by mouth daily as needed for anxiety.    Yes Historical Provider, MD  peg 3350 powder (MOVIPREP) 100 G SOLR Take 1 kit (200 g total) by mouth as directed. 04/02/14  Yes Daneil Dolin, MD  Probiotic Product (RESTORA PO) Take 1 capsule by mouth daily.   Yes Historical Provider, MD  rosuvastatin (CRESTOR) 10 MG tablet Take 10 mg by mouth daily.   Yes Historical Provider, MD    Current Facility-Administered  Medications  Medication Dose Route Frequency Provider Last Rate Last Dose  . 0.9 % NaCl with KCl 20 mEq/ L  infusion   Intravenous Continuous Phillips Grout, MD 100 mL/hr at 05/04/14 0407    . cefTRIAXone (ROCEPHIN) 1 g in dextrose 5 % 50 mL IVPB - Premix  1 g Intravenous Once Rogene Houston, MD      . hydrocortisone sodium succinate (SOLU-CORTEF) 100 MG injection 50 mg  50 mg Intravenous Q12H Saima Rizwan, MD      . insulin aspart (novoLOG) injection 0-5 Units  0-5 Units Subcutaneous 6 times per day Debbe Odea, MD   0 Units at 05/04/14 [phone]  . LORazepam (ATIVAN) injection 0.5 mg  0.5 mg Intravenous QHS PRN Debbe Odea, MD      . metoprolol (LOPRESSOR) injection 2.5 mg  2.5 mg Intravenous 4 times per day Debbe Odea, MD   2.5 mg at 05/04/14 0934  . morphine 2 MG/ML injection 2 mg  2 mg Intravenous Q4H PRN Phillips Grout, MD      . ondansetron [facility]) tablet 4 mg  4 mg Oral Q6H PRN Phillips Grout, MD       Or  . ondansetron (ZOFRAN) injection 4 mg  4 mg Intravenous Q6H PRN Phillips Grout, MD      . pantoprazole (PROTONIX) injection 40 mg  40 mg Intravenous Q24H Debbe Odea, MD   40 mg at 05/04/14 0932  . potassium chloride 10 mEq in 100 mL IVPB  10 mEq Intravenous Q1 Hr x 4 Debbe Odea, MD        Allergies as of 05/03/2014 - Review Complete 05/03/2014  Allergen Reaction Noted  . Codeine  04/03/2014  . Erythromycin Nausea And Vomiting 04/03/2014    Family History  Problem Relation Age of Onset  . Colon cancer Neg Hx   . Diverticulitis Mother   . Ulcers Mother     History   Social History  . Marital Status: Married    Spouse Name: N/A    Number of Children: N/A  . Years of Education: N/A   Occupational History  . Retired Therapist, sports    Social History Main Topics  . Smoking status: Current Some Day Smoker    Types: E-cigarettes  . Smokeless tobacco: Not on file     Comment: E-Cigarette  . Alcohol Use: No  . Drug Use: No  . Sexual Activity: Not on file   Other Topics  Concern  . Not on file   Social History Narrative    Review of Systems: See HPI, otherwise normal ROS  Physical Exam: Temp:  [98.2 F (36.8 C)-98.4 F (36.9 C)] 98.4 F (36.9 C) (12/05 0615) Pulse Rate:  [75-95] 77 (12/05 0615) Resp:  [14-28] 20 (12/05 0615) BP: (135-164)/(56-124) 141/56 mmHg (12/05 0615) SpO2:  [92 %-100 %] 92 % (12/05 0615) Weight:  [500  lb 11.2 oz (66.996 kg)-152 lb (68.947 kg)] 147 lb 11.2 oz (66.996 kg) (12/05 0348) Last BM Date: 05/03/14  Patient is alert and in no acute distress. Conjunctiva is pink. Sclerae nonicteric. Oropharyngeal mucosa is normal. No neck masses or thyromegaly noted. Cardiac exam with regular rhythm normal S1 and S2. No murmur or gallop noted. Lungs are clear to auscultation. Abdomen is symmetrical. Bowel sounds are normal. On palpation abdomen is soft with mild tenderness low the right costal margin in epigastric region. No organomegaly or masses noted. No peripheral edema or clubbing noted.  Lab Results:  Recent Labs  05/04/14 0011 05/04/14 0547  WBC 7.3 6.0  HGB 12.5 11.6*  HCT 38.7 36.5  PLT 216 192   BMET  Recent Labs  05/04/14 0011 05/04/14 0547  NA 146 147  K 3.0* 3.0*  CL 101 104  CO2 31 32  GLUCOSE 115* 150*  BUN 14 10  CREATININE 0.89 0.86  CALCIUM 9.5 9.1    Serum lipase 41. Troponin level less than 0.30  LFT  Recent Labs  05/04/14 0547  PROT 6.5  ALBUMIN 3.4*  AST 405*  ALT 255*  ALKPHOS 171*  BILITOT 1.6*   PT/INR  Recent Labs  05/04/14 0547  LABPROT 14.5  INR 1.12   Hepatitis Panel No results for input(s): HEPBSAG, HCVAB, HEPAIGM, HEPBIGM in the last 72 hours.  Studies/Results: Ct Abdomen Pelvis W Contrast  05/04/2014   CLINICAL DATA:  Intermittent epigastric abdominal pain for 4 days. Nausea and vomiting. Initial encounter.  EXAM: CT ABDOMEN AND PELVIS WITH CONTRAST  TECHNIQUE: Multidetector CT imaging of the abdomen and pelvis was performed using the standard protocol  following bolus administration of intravenous contrast.  CONTRAST:  32mL OMNIPAQUE IOHEXOL 300 MG/ML SOLN, 112mL OMNIPAQUE IOHEXOL 300 MG/ML SOLN  COMPARISON:  None.  FINDINGS: Minimal bibasilar atelectasis is noted. Diffuse coronary artery calcifications are seen. Calcification is noted at the mitral valve.  There is dilatation of the common hepatic duct to 1.0 cm, with trace surrounding fluid and inflammation. An obstructing 5 mm stone is noted at the duodenal ampulla, best seen on coronal images. There is associated dilatation of the intrahepatic biliary ducts.  The spleen is unremarkable in appearance. The gallbladder is within normal limits. The pancreas and adrenal glands are unremarkable.  A 1.1 cm cyst is noted at the lower pole of the left kidney. Nonspecific left-sided perinephric stranding is noted. The kidneys are otherwise unremarkable. There is no evidence of hydronephrosis. No renal or ureteral stones are seen.  No free fluid is identified. The small bowel is unremarkable in appearance. The stomach is within normal limits. No acute vascular abnormalities are seen. Scattered calcification is seen along the abdominal aorta and its branches.  The appendix is normal in caliber, without evidence for appendicitis. Scattered diverticulosis is noted along the proximal sigmoid colon, without evidence of diverticulitis.  The bladder is mildly distended and grossly remarkable in appearance. A small urachal remnant is incidentally seen. The patient is status post hysterectomy. No suspicious adnexal masses are identified. No inguinal lymphadenopathy is seen.  No acute osseous abnormalities are identified. There is mild grade 1 anterolisthesis of L4 on L5, with a right-sided pars defect at L4. Right convex lumbar scoliosis is noted, with mild associated degenerative change.  IMPRESSION: 1. Obstructing 5 mm stone noted at the duodenal ampulla, with associated dilatation of the common hepatic duct to 1.0 cm, and  dilatation of the intrahepatic biliary ducts. Trace associated fluid and inflammation  noted about the course of the common hepatic duct. 2. Gallbladder remains unremarkable in appearance. 3. Diffuse coronary artery calcifications seen. Calcification at the mitral valve. 4. Small left renal cyst noted. 5. Scattered calcification along the abdominal aorta and its branches. 6. Scattered diverticulosis along the proximal sigmoid colon, without evidence of diverticulitis. 7. Mild grade 1 anterolisthesis of L4 on L5, with a right-sided pars defect at L4. Right convex lumbar scoliosis noted.   Electronically Signed   By: Garald Balding M.D.   On: 05/04/2014 02:01    Assessment; Patient is 73 year old Caucasian female who presents with severe epigastric pain unresponsive to antacid; she got relief with antacids with 3 prior episodes. On workup she is noted to have elevated transaminases and dilated bile duct with single stone. Suspect she has cholesterol stones and gallbladder not identified on CT. Patient will need therapeutic ERCP. Hypokalemia. Patient is receiving IV KCl. Recent diagnosis of lymphocytic colitis. Diarrhea has resolved with budesonide(uceris)  Recommendations; Ceftriaxone 1 g IV now. ERCP with sphincterotomy and stone extraction. Procedure and risks reviewed with patient her husband and her sister. She was informed that pancreatic stent may have to be placed for prophylactic purposes. Patient is agreeable. We will proceed with procedure later today. Will repeat serum potassium if anesthesia requests.   LOS: 1 day   Devika Dragovich U  05/04/2014, 9:40 AM

## 2014-05-04 NOTE — Anesthesia Postprocedure Evaluation (Signed)
  Anesthesia Post-op Note  Patient: Diane Hawkins  Procedure(s) Performed: Procedure(s): ENDOSCOPIC RETROGRADE CHOLANGIOPANCREATOGRAPHY (ERCP) (N/A) SPHINCTEROTOMY (N/A) REMOVAL OF STONES (N/A) BALLOON DILATION (N/A)  Patient Location: Nursing Unit  Anesthesia Type:General  Level of Consciousness: awake, alert  and oriented  Airway and Oxygen Therapy: Patient Spontanous Breathing and Patient connected to nasal cannula oxygen  Post-op Pain: none  Post-op Assessment: Post-op Vital signs reviewed, Patient's Cardiovascular Status Stable, Respiratory Function Stable, Patent Airway and No signs of Nausea or vomiting  Post-op Vital Signs: Reviewed and stable  Last Vitals:  Filed Vitals:   05/04/14 1316  BP: 155/66  Pulse: 84  Temp: 36.9 C  Resp: 20    Complications: No apparent anesthesia complications

## 2014-05-04 NOTE — Plan of Care (Signed)
Problem: Phase I Progression Outcomes Goal: Pain controlled with appropriate interventions Outcome: Progressing Goal: OOB as tolerated unless otherwise ordered Outcome: Progressing Goal: Hemodynamically stable Outcome: Progressing

## 2014-05-05 LAB — GLUCOSE, CAPILLARY
GLUCOSE-CAPILLARY: 197 mg/dL — AB (ref 70–99)
Glucose-Capillary: 156 mg/dL — ABNORMAL HIGH (ref 70–99)
Glucose-Capillary: 119 mg/dL — ABNORMAL HIGH (ref 70–99)
Glucose-Capillary: 184 mg/dL — ABNORMAL HIGH (ref 70–99)

## 2014-05-05 LAB — COMPREHENSIVE METABOLIC PANEL
ALBUMIN: 3 g/dL — AB (ref 3.5–5.2)
ALT: 164 U/L — ABNORMAL HIGH (ref 0–35)
AST: 103 U/L — ABNORMAL HIGH (ref 0–37)
Alkaline Phosphatase: 151 U/L — ABNORMAL HIGH (ref 39–117)
Anion gap: 10 (ref 5–15)
BUN: 7 mg/dL (ref 6–23)
CALCIUM: 8.9 mg/dL (ref 8.4–10.5)
CO2: 27 mEq/L (ref 19–32)
Chloride: 106 mEq/L (ref 96–112)
Creatinine, Ser: 0.86 mg/dL (ref 0.50–1.10)
GFR calc Af Amer: 76 mL/min — ABNORMAL LOW (ref >90)
GFR calc non Af Amer: 66 mL/min — ABNORMAL LOW (ref >90)
Glucose, Bld: 139 mg/dL — ABNORMAL HIGH (ref 70–99)
POTASSIUM: 4.2 meq/L (ref 3.7–5.3)
Sodium: 143 mEq/L (ref 137–147)
Total Bilirubin: 0.7 mg/dL (ref 0.3–1.2)
Total Protein: 6.1 g/dL (ref 6.0–8.3)

## 2014-05-05 LAB — CBC
HEMATOCRIT: 34 % — AB (ref 36.0–46.0)
Hemoglobin: 11 g/dL — ABNORMAL LOW (ref 12.0–15.0)
MCH: 27.8 pg (ref 26.0–34.0)
MCHC: 32.4 g/dL (ref 30.0–36.0)
MCV: 85.9 fL (ref 78.0–100.0)
Platelets: 157 10*3/uL (ref 150–400)
RBC: 3.96 MIL/uL (ref 3.87–5.11)
RDW: 14.7 % (ref 11.5–15.5)
WBC: 6.8 10*3/uL (ref 4.0–10.5)

## 2014-05-05 LAB — AMYLASE: AMYLASE: 30 U/L (ref 0–105)

## 2014-05-05 MED ORDER — IBUPROFEN 800 MG PO TABS
400.0000 mg | ORAL_TABLET | Freq: Three times a day (TID) | ORAL | Status: DC | PRN
  Administered 2014-05-05: 400 mg via ORAL
  Filled 2014-05-05: qty 1

## 2014-05-05 NOTE — Discharge Summary (Signed)
Physician Discharge Summary  Diane Hawkins BTD:[id number] DOB: 1940-07-27 DOA: 05/03/2014  PCP: Clinton Quant, MD  Admit date: 05/03/2014 Discharge date: 05/05/2014  Time spent: 45 minutes  Recommendations for Outpatient Follow-up:  -Will be discharged home today. -Advised to follow up with Dr. Laural Golden and Dr. Arnoldo Morale.   Discharge Diagnoses:  Principal Problem:   Choledocholithiasis Active Problems:   Diabetes mellitus   Hypertension   Abdominal pain, acute, epigastric   GERD (gastroesophageal reflux disease)   Hypercholesterolemia   Transaminitis   Microscopic colitis   Discharge Condition: Stable and improved  Filed Weights   05/03/14 2227 05/04/14 0348  Weight: 68.947 kg (152 lb) 66.996 kg (147 lb 11.2 oz)    History of present illness:  73 yo female comes in with 4 days of severe epigastric abdominal pain that has progressively worsened especially today. It has been worse after lunchtime. With associated nausea minimal vomiting. No diarrhea. No fevers. Still has gallbladder. Usually the pain would improve with nothing, but today it lasted for well over an hour and would not let up so she came to the ER. Her pain is much better after given morphine in the ED.  Hospital Course:   Choledocholithiasis -S/p ERCP 12/5. -Pain resolved. -No nausea; tolerating diet. -She has decided to defer cholecystectomy until after the holidays.  Hypokalemia -Repleted.  HTN -Well controlled. -Resume home meds.  GERD -PPI.  HLD -Continue statin.   Procedures:  ERCP   Consultations:  GI  Surgery  Discharge Instructions  Discharge Instructions    Diet - low sodium heart healthy    Complete by:  As directed      Increase activity slowly    Complete by:  As directed             Medication List    STOP taking these medications        acetaminophen 500 MG tablet  Commonly known as:  TYLENOL     aspirin 325 MG tablet     GOODYS EXTRA  STRENGTH 500-325-65 MG Pack  Generic drug:  Aspirin-Acetaminophen-Caffeine      TAKE these medications        amLODipine 5 MG tablet  Commonly known as:  NORVASC  Take 5 mg by mouth daily.     Budesonide 9 MG Tb24  Commonly known as:  UCERIS  Take 1 capsule by mouth daily.     carvedilol 12.5 MG tablet  Commonly known as:  COREG  Take 12.5 mg by mouth 2 (two) times daily.     IMODIUM PO  Take 1 tablet by mouth daily as needed (diarrhea).     LORazepam 0.5 MG tablet  Commonly known as:  ATIVAN  Take 0.5 mg by mouth daily as needed for anxiety.     NEXIUM PO  Take 1 capsule by mouth daily.     NOVOLOG MIX 70/30 Peebles  - Inject into the skin. 25 units in the AM  - 10 units in the PM     peg 3350 powder 100 G Solr  Commonly known as:  MOVIPREP  Take 1 kit (200 g total) by mouth as directed.     RESTORA PO  Take 1 capsule by mouth daily.     rosuvastatin 10 MG tablet  Commonly known as:  CRESTOR  Take 10 mg by mouth daily.     VITAMIN D PO  Take 1,000 Units by mouth.       Allergies  Allergen Reactions  .  Codeine     Patient stated that she does not like taking any codeine   . Erythromycin Nausea And Vomiting       Follow-up Information    Follow up with Jamesetta So, MD.   Specialty:  General Surgery   Contact information:   [address] [place] [address] [phone]       Follow up with Rogene Houston, MD.   Specialty:  Gastroenterology   Contact information:   [address] [place] [address] [phone]        The results of significant diagnostics from this hospitalization (including imaging, microbiology, ancillary and laboratory) are listed below for reference.    Significant Diagnostic Studies: Ct Abdomen Pelvis W Contrast  05/04/2014   CLINICAL DATA:  Intermittent epigastric abdominal pain for 4 days. Nausea and vomiting. Initial encounter.  EXAM: CT ABDOMEN AND PELVIS WITH CONTRAST  TECHNIQUE:  Multidetector CT imaging of the abdomen and pelvis was performed using the standard protocol following bolus administration of intravenous contrast.  CONTRAST:  62mL OMNIPAQUE IOHEXOL 300 MG/ML SOLN, 152mL OMNIPAQUE IOHEXOL 300 MG/ML SOLN  COMPARISON:  None.  FINDINGS: Minimal bibasilar atelectasis is noted. Diffuse coronary artery calcifications are seen. Calcification is noted at the mitral valve.  There is dilatation of the common hepatic duct to 1.0 cm, with trace surrounding fluid and inflammation. An obstructing 5 mm stone is noted at the duodenal ampulla, best seen on coronal images. There is associated dilatation of the intrahepatic biliary ducts.  The spleen is unremarkable in appearance. The gallbladder is within normal limits. The pancreas and adrenal glands are unremarkable.  A 1.1 cm cyst is noted at the lower pole of the left kidney. Nonspecific left-sided perinephric stranding is noted. The kidneys are otherwise unremarkable. There is no evidence of hydronephrosis. No renal or ureteral stones are seen.  No free fluid is identified. The small bowel is unremarkable in appearance. The stomach is within normal limits. No acute vascular abnormalities are seen. Scattered calcification is seen along the abdominal aorta and its branches.  The appendix is normal in caliber, without evidence for appendicitis. Scattered diverticulosis is noted along the proximal sigmoid colon, without evidence of diverticulitis.  The bladder is mildly distended and grossly remarkable in appearance. A small urachal remnant is incidentally seen. The patient is status post hysterectomy. No suspicious adnexal masses are identified. No inguinal lymphadenopathy is seen.  No acute osseous abnormalities are identified. There is mild grade 1 anterolisthesis of L4 on L5, with a right-sided pars defect at L4. Right convex lumbar scoliosis is noted, with mild associated degenerative change.  IMPRESSION: 1. Obstructing 5 mm stone noted at  the duodenal ampulla, with associated dilatation of the common hepatic duct to 1.0 cm, and dilatation of the intrahepatic biliary ducts. Trace associated fluid and inflammation noted about the course of the common hepatic duct. 2. Gallbladder remains unremarkable in appearance. 3. Diffuse coronary artery calcifications seen. Calcification at the mitral valve. 4. Small left renal cyst noted. 5. Scattered calcification along the abdominal aorta and its branches. 6. Scattered diverticulosis along the proximal sigmoid colon, without evidence of diverticulitis. 7. Mild grade 1 anterolisthesis of L4 on L5, with a right-sided pars defect at L4. Right convex lumbar scoliosis noted.   Electronically Signed   By: Garald Balding M.D.   On: 05/04/2014 02:01   Dg Ercp With Sphincterotomy  05/04/2014   CLINICAL DATA:  Intraoperative ERCP with sphincterotomy and pancreatic stent placement. Choledocholithiasis.  EXAM:  ERCP  TECHNIQUE: Multiple spot images obtained with the fluoroscopic device and submitted for interpretation post-procedure.  COMPARISON:  CT 05/04/2014  FINDINGS: Seven intraprocedural fluoroscopic images are provided, demonstrating cannulation of the normal caliber common duct and pancreatic duct. No intrahepatic ductal dilatation. Balloon sweep is performed, with multiple mobile filling defects at the distal common duct near the ampulla which could represent gas bubbles or stones. Pancreatic stent placed.  IMPRESSION: ERCP imaging as above.  These images were submitted for radiologic interpretation only. Please see the procedural report for the amount of contrast and the fluoroscopy time utilized.   Electronically Signed   By: Conchita Paris M.D.   On: 05/04/2014 14:24    Microbiology: No results found for this or any previous visit (from the past 240 hour(s)).   Labs: Basic Metabolic Panel:  Recent Labs Lab 05/04/14 0011 05/04/14 0547 05/04/14 1042 05/05/14 0557  NA 146 147 140 143  K 3.0* 3.0*  3.2* 4.2  CL 101 104  --  106  CO2 31 32  --  27  GLUCOSE 115* 150* 127* 139*  BUN 14 10  --  7  CREATININE 0.89 0.86  --  0.86  CALCIUM 9.5 9.1  --  8.9   Liver Function Tests:  Recent Labs Lab 05/04/14 0011 05/04/14 0547 05/05/14 0557  AST 309* 405* 103*  ALT 159* 255* 164*  ALKPHOS 165* 171* 151*  BILITOT 1.2 1.6* 0.7  PROT 7.2 6.5 6.1  ALBUMIN 3.9 3.4* 3.0*    Recent Labs Lab 05/04/14 0011 05/05/14 0557  LIPASE 41  --   AMYLASE  --  30   No results for input(s): AMMONIA in the last 168 hours. CBC:  Recent Labs Lab 05/04/14 0011 05/04/14 0547 05/04/14 1042 05/05/14 0557  WBC 7.3 6.0  --  6.8  NEUTROABS 5.5  --   --   --   HGB 12.5 11.6* 12.2 11.0*  HCT 38.7 36.5 36.0 34.0*  MCV 85.6 85.9  --  85.9  PLT 216 192  --  157   Cardiac Enzymes:  Recent Labs Lab 05/04/14 0011  TROPONINI <0.30   BNP: BNP (last 3 results) No results for input(s): PROBNP in the last 8760 hours. CBG:  Recent Labs Lab 05/04/14 2034 05/05/14 0017 05/05/14 0432 05/05/14 0735 05/05/14 1135  GLUCAP 167* 197* 156* 119* 184*       Signed:  HERNANDEZ ACOSTA,ESTELA  [facility] Pager: [phone] 05/05/2014, 1:51 PM

## 2014-05-05 NOTE — Plan of Care (Signed)
Problem: Phase I Progression Outcomes Goal: Hemodynamically stable Outcome: Adequate for Discharge  Problem: Phase II Progression Outcomes Goal: Discharge plan established Outcome: Completed/Met Date Met:  05/05/14 Goal: IV changed to normal saline lock Outcome: Completed/Met Date Met:  05/05/14  Problem: Phase III Progression Outcomes Goal: Pain controlled on oral analgesia Outcome: Completed/Met Date Met:  05/05/14 Goal: IV/normal saline lock discontinued Outcome: Completed/Met Date Met:  05/05/14 Goal: Foley discontinued Outcome: Not Applicable Date Met:  76/28/31 Goal: Discharge plan remains appropriate-arrangements made Outcome: Completed/Met Date Met:  05/05/14 Goal: Other Phase III Outcomes/Goals Outcome: Not Applicable Date Met:  51/76/16  Problem: Discharge Progression Outcomes Goal: Discharge plan in place and appropriate Outcome: Completed/Met Date Met:  05/05/14 Goal: Pain controlled with appropriate interventions Outcome: Completed/Met Date Met:  05/05/14 Goal: Hemodynamically stable Outcome: Completed/Met Date Met:  07/37/10 Goal: Complications resolved/controlled Outcome: Completed/Met Date Met:  05/05/14 Goal: Tolerating diet Outcome: Completed/Met Date Met:  05/05/14 Goal: Activity appropriate for discharge plan Outcome: Completed/Met Date Met:  05/05/14 Goal: Other Discharge Outcomes/Goals Outcome: Not Applicable Date Met:  62/69/48

## 2014-05-05 NOTE — Progress Notes (Signed)
Patient would like to defer cholecystectomy at this time and do it as an outpatient. That is fine with me. She will call my office for follow-up appointment.

## 2014-05-05 NOTE — Plan of Care (Signed)
Problem: Consults Goal: General Medical Patient Education See Patient Education Module for specific education.  Outcome: Completed/Met Date Met:  05/05/14 Goal: Skin Care Protocol Initiated - if Braden Score 18 or less If consults are not indicated, leave blank or document N/A  Outcome: Not Applicable Date Met:  61/47/09 Goal: Nutrition Consult-if indicated Outcome: Not Applicable Date Met:  29/57/47 Goal: Diabetes Guidelines if Diabetic/Glucose > 140 If diabetic or lab glucose is > 140 mg/dl - Initiate Diabetes/Hyperglycemia Guidelines & Document Interventions  Outcome: Completed/Met Date Met:  05/05/14  Problem: Phase I Progression Outcomes Goal: Pain controlled with appropriate interventions Outcome: Completed/Met Date Met:  05/05/14 Goal: OOB as tolerated unless otherwise ordered Outcome: Completed/Met Date Met:  05/05/14 Goal: Initial discharge plan identified Outcome: Completed/Met Date Met:  05/05/14 Goal: Voiding-avoid urinary catheter unless indicated Outcome: Completed/Met Date Met:  05/05/14 Goal: Hemodynamically stable Outcome: Progressing Goal: Other Phase I Outcomes/Goals Outcome: Not Applicable Date Met:  34/03/70  Problem: Phase II Progression Outcomes Goal: Progress activity as tolerated unless otherwise ordered Outcome: Completed/Met Date Met:  05/05/14 Goal: Discharge plan established Outcome: Progressing Goal: Vital signs remain stable Outcome: Completed/Met Date Met:  05/05/14 Goal: Obtain order to discontinue catheter if appropriate Outcome: Not Applicable Date Met:  96/43/83 Goal: Other Phase II Outcomes/Goals Outcome: Not Applicable Date Met:  81/84/03  Problem: Phase III Progression Outcomes Goal: Activity at appropriate level-compared to baseline (UP IN CHAIR FOR HEMODIALYSIS)  Outcome: Completed/Met Date Met:  05/05/14 Goal: Voiding independently Outcome: Completed/Met Date Met:  05/05/14

## 2014-05-05 NOTE — Progress Notes (Signed)
  Subjective:  Patient has no complaints. She denies abdominal pain nausea or vomiting. She had no difficulty with clear liquids. She would like to delay cholecystectomy until after the holidays.   Objective: Blood pressure 134/62, pulse 84, temperature 98.8 F (37.1 C), temperature source Oral, resp. rate 20, height 5' 3.5" (1.613 m), weight 147 lb 11.2 oz (66.996 kg), SpO2 94 %. Patient is alert and in no acute distress.. Abdomen. Bowel sounds are normal. Abdomen is soft and nontender without organomegaly or masses. No LE edema or clubbing noted.  Labs/studies Results:   Recent Labs  05/04/14 0011 05/04/14 0547 05/04/14 1042 05/05/14 0557  WBC 7.3 6.0  --  6.8  HGB 12.5 11.6* 12.2 11.0*  HCT 38.7 36.5 36.0 34.0*  PLT 216 192  --  157    BMET   Recent Labs  05/04/14 0011 05/04/14 0547 05/04/14 1042 05/05/14 0557  NA 146 147 140 143  K 3.0* 3.0* 3.2* 4.2  CL 101 104  --  106  CO2 31 32  --  27  GLUCOSE 115* 150* 127* 139*  BUN 14 10  --  7  CREATININE 0.89 0.86  --  0.86  CALCIUM 9.5 9.1  --  8.9    LFT   Recent Labs  05/04/14 0011 05/04/14 0547 05/05/14 0557  PROT 7.2 6.5 6.1  ALBUMIN 3.9 3.4* 3.0*  AST 309* 405* 103*  ALT 159* 255* 164*  ALKPHOS 165* 171* 151*  BILITOT 1.2 1.6* 0.7    Serum amylase is 30  Assessment:  #1. Choledocholithiasis. Patient is status post therapeutic ERCP yesterday. She is doing well. Pain has resolved. She will need cholecystectomy. Surgical consultation pending. #2. Hypokalemia has also resolved.  Recommendations;  Advance diet to heart healthy diet. Patient advised to look for pancreatic stent in stool and if she passes it to let us know otherwise she will need plain abdominal film in 2-3 weeks.

## 2014-05-05 NOTE — Anesthesia Postprocedure Evaluation (Signed)
  Anesthesia Post-op Note  Patient: Diane Hawkins  Procedure(s) Performed: Procedure(s): ENDOSCOPIC RETROGRADE CHOLANGIOPANCREATOGRAPHY (ERCP) (N/A) SPHINCTEROTOMY (N/A) REMOVAL OF STONES (N/A) BALLOON DILATION (N/A)  Patient Location: Nursing Unit  Anesthesia Type:General  Level of Consciousness: awake, alert  and oriented  Airway and Oxygen Therapy: Patient Spontanous Breathing  Post-op Pain: none  Post-op Assessment: Post-op Vital signs reviewed, Patient's Cardiovascular Status Stable, Respiratory Function Stable, Patent Airway and No signs of Nausea or vomiting  Post-op Vital Signs: Reviewed and stable  Last Vitals:  Filed Vitals:   05/05/14 0504  BP: 134/62  Pulse: 84  Temp: 37.1 C  Resp: 20    Complications: No apparent anesthesia complications

## 2014-05-05 NOTE — Addendum Note (Signed)
Addendum  created 05/05/14 1417 by Ollen Bowl, CRNA   Modules edited: Anesthesia Blocks and Procedures, Clinical Notes, Notes Section   Clinical Notes:  File: [id number]; File: [id number]   Notes Section:  File: [id number]

## 2014-05-05 NOTE — Progress Notes (Signed)
NURSING PROGRESS NOTE  Diane Hawkins [id number] Discharge Data: 05/05/2014 2:21 PM Attending Provider: Koleen Nimrod Acost* OZH:[id number],GEXBMW L, MD   [name] to be D/C'd Home per MD order.    All IV's discontinued and monitored for bleeding.  All belongings returned to patient for patient to take home.  AVS summary reviewed with patient. Education provided on low-fat diet for gallbladder issues and lap cholecystectomy.  Patient left floor via wheelchair, escorted by RN.  Last Documented Vital Signs:  Blood pressure 134/62, pulse 84, temperature 98.8 F (37.1 C), temperature source Oral, resp. rate 20, height 5' 3.5" (1.613 m), weight 66.996 kg (147 lb 11.2 oz), SpO2 94 %.  [name]

## 2014-05-06 ENCOUNTER — Encounter (HOSPITAL_COMMUNITY): Payer: Self-pay | Admitting: Internal Medicine

## 2014-05-06 NOTE — Care Management Utilization Note (Signed)
UR complete 

## 2014-05-17 NOTE — H&P (Signed)
  NTS SOAP Note  Vital Signs:  Vitals as of: 09/47/0962: Systolic 836: Diastolic 73: Heart Rate 68: Temp 95.57F: Height 95ft 3.5in: Weight 153Lbs 0 Ounces: BMI 26.68  BMI : 26.68 kg/m2  Subjective: This 73 year old female presents for of gallstones.  Had ERCP recently for choledocholithiasis.  Now presents for cholecystectomy.  Feels fine currently.  Thinks pancreatic stent still in place.  No fever,  chills,  jaundice.  Review of Symptoms:  Constitutional:unremarkable   Head:unremarkable Eyes:unremarkable   Nose/Mouth/Throat:unremarkable Cardiovascular:  unremarkable Respiratory:unremarkable Gastrointestinheartburn, dyspepsia Genitourinary:unremarkable   back pain dry Hematolgic/Lymphatic:unremarkable   Allergic/Immunologic:unremarkable   Past Medical History:  Reviewed  Past Medical History  Surgical History: ERCP for choledocholithiasis,  TAH,  ganglion cyst right wrist,  lumbar surgery Medical Problems: HTN,  IDDM,  high cholesterol Allergies: codeine,  erythromycin  Medications: insulin,  nexium,  amlodipine,  carvedilol,  crestor,  asa,  lorazepam    Social History:Reviewed  Social History  Preferred Language: English Race:  White Ethnicity: Not Hispanic / Latino Age: 73 year Marital Status:  M Alcohol: no   Smoking Status: Current every day smoker reviewed on 05/16/2014 Started Date:  Packs per day: 0.50 Functional Status reviewed on 05/16/2014 ------------------------------------------------ Bathing: Normal Cooking: Normal Dressing: Normal Driving: Normal Eating: Normal Managing Meds: Normal Oral Care: Normal Shopping: Normal Toileting: Normal Transferring: Normal Walking: Normal Cognitive Status reviewed on 05/16/2014 ------------------------------------------------ Attention: Normal Decision Making: Normal Language: Normal Memory: Normal Motor: Normal Perception: Normal Problem Solving: Normal Visual and Spatial:  Normal   Family History:Reviewed  Family Health History Mother, Deceased; Diabetes mellitus, unspecified type;  Father, Deceased; Stroke (CVA);     Objective Information: General:Well appearing, well nourished in no distress. no scleral icterus Heart:RRR, no murmur or gallop.  Normal S1, S2.  No S3, S4.  Lungs:  CTA bilaterally, no wheezes, rhonchi, rales.  Breathing unlabored. Abdomen:Soft, NT/ND, normal bowel sounds, no HSM, no masses.  No peritoneal signs.   Assessment:Cholelithiasis  Diagnoses: 574.20  K80.20 Gallstone (Calculus of gallbladder without cholecystitis without obstruction)  Procedures: 62947 - OFFICE OUTPATIENT NEW 30 MINUTES    Plan:  Scheduled for laparoscopic cholecystectomy on 05/27/14.   Patient Education:Alternative treatments to surgery were discussed with patient (and family).  Risks and benefits  of procedure including bleeding,  infection,  hepatobiliary injury,  and the possibility of an open procedure were fully explained to the patient (and family) who gave informed consent. Patient/family questions were addressed.  Follow-up:Pending Surgery

## 2014-05-20 NOTE — Patient Instructions (Signed)
Diane Hawkins  05/20/2014   Your procedure is scheduled on:   05/27/2014  Report to [facility] at  29  AM.  Call this number if you have problems the morning of surgery: [phone]   Remember:   Do not eat food or drink liquids after midnight.   Take these medicines the morning of surgery with A SIP OF WATER:  Amlodipine, uceris, carvedilol, lorazepam  Do not wear jewelry, make-up or nail polish.  Do not wear lotions, powders, or perfumes.  Do not shave 48 hours prior to surgery. Men may shave face and neck.  Do not bring valuables to the hospital.  [facility] is not responsible for any belongings or valuables.               Contacts, dentures or bridgework may not be worn into surgery.  Leave suitcase in the car. After surgery it may be brought to your room.  For patients admitted to the hospital, discharge time is determined by your treatment team.               Patients discharged the day of surgery will not be allowed to drive home.  Name and phone number of your driver: family  Special Instructions: Shower using CHG 2 nights before surgery and the night before surgery.  If you shower the day of surgery use CHG.  Use special wash - you have one bottle of CHG for all showers.  You should use approximately 1/3 of the bottle for each shower.   Please read over the following fact sheets that you were given: Pain Booklet, Coughing and Deep Breathing, Surgical Site Infection Prevention, Anesthesia Post-op Instructions and Care and Recovery After Surgery Laparoscopic Cholecystectomy Laparoscopic cholecystectomy is surgery to remove the gallbladder. The gallbladder is located in the upper right part of the abdomen, behind the liver. It is a storage sac for bile produced in the liver. Bile aids in the digestion and absorption of fats. Cholecystectomy is often done for inflammation of the gallbladder (cholecystitis). This condition is usually caused by a buildup of gallstones  (cholelithiasis) in your gallbladder. Gallstones can block the flow of bile, resulting in inflammation and pain. In severe cases, emergency surgery may be required. When emergency surgery is not required, you will have time to prepare for the procedure. Laparoscopic surgery is an alternative to open surgery. Laparoscopic surgery has a shorter recovery time. Your common bile duct may also need to be examined during the procedure. If stones are found in the common bile duct, they may be removed. LET [facility] CARE PROVIDER KNOW ABOUT:  Any allergies you have.  All medicines you are taking, including vitamins, herbs, eye drops, creams, and over-the-counter medicines.  Previous problems you or members of your family have had with the use of anesthetics.  Any blood disorders you have.  Previous surgeries you have had.  Medical conditions you have. RISKS AND COMPLICATIONS Generally, this is a safe procedure. However, as with any procedure, complications can occur. Possible complications include:  Infection.  Damage to the common bile duct, nerves, arteries, veins, or other internal organs such as the stomach, liver, or intestines.  Bleeding.  A stone may remain in the common bile duct.  A bile leak from the cyst duct that is clipped when your gallbladder is removed.  The need to convert to open surgery, which requires a larger incision in the abdomen. This may be necessary if your surgeon thinks  it is not safe to continue with a laparoscopic procedure. BEFORE THE PROCEDURE  Ask your health care provider about changing or stopping any regular medicines. You will need to stop taking aspirin or blood thinners at least 5 days prior to surgery.  Do not eat or drink anything after midnight the night before surgery.  Let your health care provider know if you develop a cold or other infectious problem before surgery. PROCEDURE   You will be given medicine to make you sleep through the  procedure (general anesthetic). A breathing tube will be placed in your mouth.  When you are asleep, your surgeon will make several small cuts (incisions) in your abdomen.  A thin, lighted tube with a tiny camera on the end (laparoscope) is inserted through one of the small incisions. The camera on the laparoscope sends a picture to a TV screen in the operating room. This gives the surgeon a good view inside your abdomen.  A gas will be pumped into your abdomen. This expands your abdomen so that the surgeon has more room to perform the surgery.  Other tools needed for the procedure are inserted through the other incisions. The gallbladder is removed through one of the incisions.  After the removal of your gallbladder, the incisions will be closed with stitches, staples, or skin glue. AFTER THE PROCEDURE  You will be taken to a recovery area where your progress will be checked often.  You may be allowed to go home the same day if your pain is controlled and you can tolerate liquids. Document Released: 05/17/2005 Document Revised: 03/07/2013 Document Reviewed: 12/27/2012 [facility] Patient Information 2015 [place], [place]. This information is not intended to replace advice given to you by your health care provider. Make sure you discuss any questions you have with your health care provider. PATIENT INSTRUCTIONS POST-ANESTHESIA  IMMEDIATELY FOLLOWING SURGERY:  Do not drive or operate machinery for the first twenty four hours after surgery.  Do not make any important decisions for twenty four hours after surgery or while taking narcotic pain medications or sedatives.  If you develop intractable nausea and vomiting or a severe headache please notify your doctor immediately.  FOLLOW-UP:  Please make an appointment with your surgeon as instructed. You do not need to follow up with anesthesia unless specifically instructed to do so.  WOUND CARE INSTRUCTIONS (if applicable):  Keep a dry clean dressing  on the anesthesia/puncture wound site if there is drainage.  Once the wound has quit draining you may leave it open to air.  Generally you should leave the bandage intact for twenty four hours unless there is drainage.  If the epidural site drains for more than 36-48 hours please call the anesthesia department.  QUESTIONS?:  Please feel free to call your physician or the hospital operator if you have any questions, and they will be happy to assist you.

## 2014-05-22 ENCOUNTER — Ambulatory Visit (HOSPITAL_COMMUNITY)
Admission: RE | Admit: 2014-05-22 | Discharge: 2014-05-22 | Disposition: A | Discharge status: Home / Self Care | Payer: Medicare Other | Source: Ambulatory Visit | Attending: General Surgery | Admitting: General Surgery

## 2014-05-22 ENCOUNTER — Encounter (HOSPITAL_COMMUNITY)
Admission: RE | Admit: 2014-05-22 | Discharge: 2014-05-22 | Disposition: A | Discharge status: Home / Self Care | Payer: Medicare Other | Source: Ambulatory Visit | Attending: General Surgery | Admitting: General Surgery

## 2014-05-22 ENCOUNTER — Encounter (HOSPITAL_COMMUNITY): Payer: Self-pay

## 2014-05-22 VITALS — BP 121/58 | HR 72 | Temp 97.9°F | Resp 20 | Ht 63.5 in | Wt 153.0 lb

## 2014-05-22 DIAGNOSIS — Z4689 Encounter for fitting and adjustment of other specified devices: Secondary | ICD-10-CM | POA: Insufficient documentation

## 2014-05-22 DIAGNOSIS — I10 Essential (primary) hypertension: Secondary | ICD-10-CM

## 2014-05-22 DIAGNOSIS — K8050 Calculus of bile duct without cholangitis or cholecystitis without obstruction: Secondary | ICD-10-CM

## 2014-05-22 HISTORY — DX: Unspecified glaucoma: H40.9

## 2014-05-22 LAB — CBC WITH DIFFERENTIAL/PLATELET
BASOS PCT: 0 % (ref 0–1)
Basophils Absolute: 0 10*3/uL (ref 0.0–0.1)
Eosinophils Absolute: 0.1 10*3/uL (ref 0.0–0.7)
Eosinophils Relative: 2 % (ref 0–5)
HCT: 37.4 % (ref 36.0–46.0)
Hemoglobin: 11.7 g/dL — ABNORMAL LOW (ref 12.0–15.0)
Lymphocytes Relative: 26 % (ref 12–46)
Lymphs Abs: 1.9 10*3/uL (ref 0.7–4.0)
MCH: 27.3 pg (ref 26.0–34.0)
MCHC: 31.3 g/dL (ref 30.0–36.0)
MCV: 87.2 fL (ref 78.0–100.0)
MONO ABS: 0.5 10*3/uL (ref 0.1–1.0)
Monocytes Relative: 6 % (ref 3–12)
NEUTROS ABS: 5 10*3/uL (ref 1.7–7.7)
NEUTROS PCT: 66 % (ref 43–77)
PLATELETS: 236 10*3/uL (ref 150–400)
RBC: 4.29 MIL/uL (ref 3.87–5.11)
RDW: 14.7 % (ref 11.5–15.5)
WBC: 7.5 10*3/uL (ref 4.0–10.5)

## 2014-05-22 LAB — HEPATIC FUNCTION PANEL
ALK PHOS: 93 U/L (ref 39–117)
ALT: 13 U/L (ref 0–35)
AST: 18 U/L (ref 0–37)
Albumin: 3.7 g/dL (ref 3.5–5.2)
BILIRUBIN DIRECT: 0.1 mg/dL (ref 0.0–0.3)
BILIRUBIN INDIRECT: 0.5 mg/dL (ref 0.3–0.9)
Total Bilirubin: 0.6 mg/dL (ref 0.3–1.2)
Total Protein: 6.6 g/dL (ref 6.0–8.3)

## 2014-05-22 LAB — BASIC METABOLIC PANEL
ANION GAP: 6 (ref 5–15)
BUN: 14 mg/dL (ref 6–23)
CALCIUM: 9.7 mg/dL (ref 8.4–10.5)
CO2: 27 mmol/L (ref 19–32)
CREATININE: 0.98 mg/dL (ref 0.50–1.10)
Chloride: 108 mEq/L (ref 96–112)
GFR calc non Af Amer: 56 mL/min — ABNORMAL LOW (ref >90)
GFR, EST AFRICAN AMERICAN: 65 mL/min — AB (ref >90)
Glucose, Bld: 74 mg/dL (ref 70–99)
Potassium: 3.6 mmol/L (ref 3.5–5.1)
SODIUM: 141 mmol/L (ref 135–145)

## 2014-05-27 ENCOUNTER — Ambulatory Visit (HOSPITAL_COMMUNITY)
Admission: RE | Admit: 2014-05-27 | Discharge: 2014-05-27 | Disposition: A | Discharge status: Home / Self Care | Payer: Medicare Other | Source: Ambulatory Visit | Attending: General Surgery | Admitting: General Surgery

## 2014-05-27 ENCOUNTER — Ambulatory Visit (HOSPITAL_COMMUNITY): Payer: Medicare Other | Admitting: Anesthesiology

## 2014-05-27 ENCOUNTER — Encounter (HOSPITAL_COMMUNITY): Payer: Self-pay | Admitting: *Deleted

## 2014-05-27 ENCOUNTER — Encounter (HOSPITAL_COMMUNITY)
Admission: RE | Disposition: A | Discharge status: Home / Self Care | Payer: Self-pay | Source: Ambulatory Visit | Attending: General Surgery

## 2014-05-27 DIAGNOSIS — I10 Essential (primary) hypertension: Secondary | ICD-10-CM | POA: Insufficient documentation

## 2014-05-27 DIAGNOSIS — Z888 Allergy status to other drugs, medicaments and biological substances status: Secondary | ICD-10-CM | POA: Insufficient documentation

## 2014-05-27 DIAGNOSIS — K811 Chronic cholecystitis: Secondary | ICD-10-CM | POA: Insufficient documentation

## 2014-05-27 DIAGNOSIS — Z87891 Personal history of nicotine dependence: Secondary | ICD-10-CM | POA: Insufficient documentation

## 2014-05-27 DIAGNOSIS — K219 Gastro-esophageal reflux disease without esophagitis: Secondary | ICD-10-CM | POA: Diagnosis not present

## 2014-05-27 DIAGNOSIS — E780 Pure hypercholesterolemia: Secondary | ICD-10-CM | POA: Diagnosis not present

## 2014-05-27 DIAGNOSIS — E109 Type 1 diabetes mellitus without complications: Secondary | ICD-10-CM | POA: Insufficient documentation

## 2014-05-27 DIAGNOSIS — Z79899 Other long term (current) drug therapy: Secondary | ICD-10-CM | POA: Diagnosis not present

## 2014-05-27 DIAGNOSIS — Z886 Allergy status to analgesic agent status: Secondary | ICD-10-CM | POA: Insufficient documentation

## 2014-05-27 DIAGNOSIS — K8020 Calculus of gallbladder without cholecystitis without obstruction: Secondary | ICD-10-CM | POA: Diagnosis present

## 2014-05-27 DIAGNOSIS — Z794 Long term (current) use of insulin: Secondary | ICD-10-CM | POA: Insufficient documentation

## 2014-05-27 HISTORY — PX: CHOLECYSTECTOMY: SHX55

## 2014-05-27 LAB — GLUCOSE, CAPILLARY
Glucose-Capillary: 168 mg/dL — ABNORMAL HIGH (ref 70–99)
Glucose-Capillary: 228 mg/dL — ABNORMAL HIGH (ref 70–99)

## 2014-05-27 SURGERY — LAPAROSCOPIC CHOLECYSTECTOMY
Anesthesia: General | Site: Abdomen

## 2014-05-27 MED ORDER — LACTATED RINGERS IV SOLN
INTRAVENOUS | Status: DC
  Administered 2014-05-27 (×2): via INTRAVENOUS

## 2014-05-27 MED ORDER — SODIUM CHLORIDE 0.9 % IR SOLN
Status: DC | PRN
  Administered 2014-05-27: 500 mL

## 2014-05-27 MED ORDER — ROCURONIUM BROMIDE 50 MG/5ML IV SOLN
INTRAVENOUS | Status: AC
  Filled 2014-05-27: qty 1

## 2014-05-27 MED ORDER — ONDANSETRON HCL 4 MG/2ML IJ SOLN
4.0000 mg | Freq: Once | INTRAMUSCULAR | Status: AC | PRN
  Administered 2014-05-27: 4 mg via INTRAVENOUS
  Filled 2014-05-27: qty 2

## 2014-05-27 MED ORDER — LIDOCAINE HCL (PF) 1 % IJ SOLN
INTRAMUSCULAR | Status: AC
Start: 2014-05-27 — End: 2014-05-27
  Filled 2014-05-27: qty 5

## 2014-05-27 MED ORDER — BUPIVACAINE HCL (PF) 0.5 % IJ SOLN
INTRAMUSCULAR | Status: DC | PRN
  Administered 2014-05-27: 10 mL

## 2014-05-27 MED ORDER — FENTANYL CITRATE 0.05 MG/ML IJ SOLN
INTRAMUSCULAR | Status: DC | PRN
  Administered 2014-05-27 (×4): 50 ug via INTRAVENOUS

## 2014-05-27 MED ORDER — CHLORHEXIDINE GLUCONATE 4 % EX LIQD: 1.0000 "application " | Freq: Once | CUTANEOUS | Status: DC

## 2014-05-27 MED ORDER — GLYCOPYRROLATE 0.2 MG/ML IJ SOLN
INTRAMUSCULAR | Status: DC | PRN
  Administered 2014-05-27: .5 mg via INTRAVENOUS

## 2014-05-27 MED ORDER — DEXAMETHASONE SODIUM PHOSPHATE 4 MG/ML IJ SOLN
4.0000 mg | Freq: Once | INTRAMUSCULAR | Status: AC
  Administered 2014-05-27: 4 mg via INTRAVENOUS

## 2014-05-27 MED ORDER — TRAMADOL HCL 50 MG PO TABS: 50.0000 mg | ORAL_TABLET | Freq: Four times a day (QID) | ORAL | Status: DC | PRN

## 2014-05-27 MED ORDER — LIDOCAINE HCL 1 % IJ SOLN
INTRAMUSCULAR | Status: DC | PRN
  Administered 2014-05-27: 30 mg via INTRADERMAL

## 2014-05-27 MED ORDER — MIDAZOLAM HCL 2 MG/2ML IJ SOLN
INTRAMUSCULAR | Status: AC
  Filled 2014-05-27: qty 2

## 2014-05-27 MED ORDER — CIPROFLOXACIN IN D5W 400 MG/200ML IV SOLN
INTRAVENOUS | Status: AC
  Filled 2014-05-27: qty 200

## 2014-05-27 MED ORDER — PROPOFOL 10 MG/ML IV BOLUS
INTRAVENOUS | Status: DC | PRN
  Administered 2014-05-27: 100 mg via INTRAVENOUS

## 2014-05-27 MED ORDER — CIPROFLOXACIN IN D5W 400 MG/200ML IV SOLN
400.0000 mg | INTRAVENOUS | Status: AC
  Administered 2014-05-27: 400 mg via INTRAVENOUS

## 2014-05-27 MED ORDER — FENTANYL CITRATE 0.05 MG/ML IJ SOLN
INTRAMUSCULAR | Status: AC
  Filled 2014-05-27: qty 2

## 2014-05-27 MED ORDER — PROPOFOL 10 MG/ML IV EMUL
INTRAVENOUS | Status: AC
  Filled 2014-05-27: qty 20

## 2014-05-27 MED ORDER — FENTANYL CITRATE 0.05 MG/ML IJ SOLN
INTRAMUSCULAR | Status: AC
  Filled 2014-05-27: qty 5

## 2014-05-27 MED ORDER — ONDANSETRON HCL 4 MG/2ML IJ SOLN
INTRAMUSCULAR | Status: AC
  Filled 2014-05-27: qty 2

## 2014-05-27 MED ORDER — NEOSTIGMINE METHYLSULFATE 10 MG/10ML IV SOLN
INTRAVENOUS | Status: DC | PRN
  Administered 2014-05-27: 3 mg via INTRAVENOUS

## 2014-05-27 MED ORDER — HEMOSTATIC AGENTS (NO CHARGE) OPTIME
TOPICAL | Status: DC | PRN
  Administered 2014-05-27: 1 via TOPICAL

## 2014-05-27 MED ORDER — FENTANYL CITRATE 0.05 MG/ML IJ SOLN
25.0000 ug | INTRAMUSCULAR | Status: DC | PRN
  Administered 2014-05-27 (×2): 50 ug via INTRAVENOUS

## 2014-05-27 MED ORDER — DEXAMETHASONE SODIUM PHOSPHATE 4 MG/ML IJ SOLN
INTRAMUSCULAR | Status: AC
  Filled 2014-05-27: qty 1

## 2014-05-27 MED ORDER — KETOROLAC TROMETHAMINE 30 MG/ML IJ SOLN
INTRAMUSCULAR | Status: AC
  Filled 2014-05-27: qty 1

## 2014-05-27 MED ORDER — BUPIVACAINE HCL (PF) 0.5 % IJ SOLN
INTRAMUSCULAR | Status: AC
  Filled 2014-05-27: qty 30

## 2014-05-27 MED ORDER — GLYCOPYRROLATE 0.2 MG/ML IJ SOLN
INTRAMUSCULAR | Status: AC
  Filled 2014-05-27: qty 2

## 2014-05-27 MED ORDER — MIDAZOLAM HCL 2 MG/2ML IJ SOLN
1.0000 mg | INTRAMUSCULAR | Status: AC | PRN
  Administered 2014-05-27 (×3): 2 mg via INTRAVENOUS
  Filled 2014-05-27 (×2): qty 2

## 2014-05-27 MED ORDER — ONDANSETRON HCL 4 MG/2ML IJ SOLN
4.0000 mg | Freq: Once | INTRAMUSCULAR | Status: AC
  Administered 2014-05-27: 4 mg via INTRAVENOUS

## 2014-05-27 MED ORDER — SUCCINYLCHOLINE CHLORIDE 20 MG/ML IJ SOLN
INTRAMUSCULAR | Status: DC | PRN
  Administered 2014-05-27: 100 mg via INTRAVENOUS

## 2014-05-27 MED ORDER — KETOROLAC TROMETHAMINE 30 MG/ML IJ SOLN
30.0000 mg | Freq: Once | INTRAMUSCULAR | Status: AC
  Administered 2014-05-27: 30 mg via INTRAVENOUS

## 2014-05-27 MED ORDER — POVIDONE-IODINE 10 % OINT PACKET
TOPICAL_OINTMENT | CUTANEOUS | Status: DC | PRN
  Administered 2014-05-27: 1 via TOPICAL

## 2014-05-27 MED ORDER — POVIDONE-IODINE 10 % EX OINT
TOPICAL_OINTMENT | CUTANEOUS | Status: AC
  Filled 2014-05-27: qty 1

## 2014-05-27 MED ORDER — ROCURONIUM BROMIDE 100 MG/10ML IV SOLN
INTRAVENOUS | Status: DC | PRN
  Administered 2014-05-27: 20 mg via INTRAVENOUS
  Administered 2014-05-27: 5 mg via INTRAVENOUS

## 2014-05-27 SURGICAL SUPPLY — 45 items
APPLIER CLIP LAPSCP 10X32 DD (CLIP) ×3 IMPLANT
BAG HAMPER (MISCELLANEOUS) ×3 IMPLANT
BAG SPEC RTRVL LRG 6X4 10 (ENDOMECHANICALS) ×1
BLADE 11 SAFETY STRL DISP (BLADE) ×3 IMPLANT
CHLORAPREP W/TINT 26ML (MISCELLANEOUS) ×3 IMPLANT
CLOTH BEACON ORANGE TIMEOUT ST (SAFETY) ×3 IMPLANT
COVER LIGHT HANDLE STERIS (MISCELLANEOUS) ×6 IMPLANT
DECANTER SPIKE VIAL GLASS SM (MISCELLANEOUS) ×3 IMPLANT
ELECT REM PT RETURN 9FT ADLT (ELECTROSURGICAL) ×3
ELECTRODE REM PT RTRN 9FT ADLT (ELECTROSURGICAL) ×1 IMPLANT
FILTER SMOKE EVAC LAPAROSHD (FILTER) ×3 IMPLANT
FORMALIN 10 PREFIL 120ML (MISCELLANEOUS) ×3 IMPLANT
GLOVE ECLIPSE 6.5 STRL STRAW (GLOVE) ×2 IMPLANT
GLOVE ECLIPSE 7.0 STRL STRAW (GLOVE) ×2 IMPLANT
GLOVE INDICATOR 7.0 STRL GRN (GLOVE) ×4 IMPLANT
GLOVE INDICATOR 7.5 STRL GRN (GLOVE) ×4 IMPLANT
GLOVE SURG SS PI 7.5 STRL IVOR (GLOVE) ×3 IMPLANT
GOWN STRL REUS W/ TWL XL LVL3 (GOWN DISPOSABLE) ×1 IMPLANT
GOWN STRL REUS W/TWL LRG LVL3 (GOWN DISPOSABLE) ×6 IMPLANT
GOWN STRL REUS W/TWL XL LVL3 (GOWN DISPOSABLE) ×3
HEMOSTAT SNOW SURGICEL 2X4 (HEMOSTASIS) ×3 IMPLANT
INST SET LAPROSCOPIC AP (KITS) ×3 IMPLANT
IV NS IRRIG 3000ML ARTHROMATIC (IV SOLUTION) IMPLANT
KIT ROOM TURNOVER APOR (KITS) ×3 IMPLANT
MANIFOLD NEPTUNE II (INSTRUMENTS) ×3 IMPLANT
NDL INSUFFLATION 14GA 120MM (NEEDLE) ×1 IMPLANT
NEEDLE INSUFFLATION 14GA 120MM (NEEDLE) ×3 IMPLANT
NS IRRIG 1000ML POUR BTL (IV SOLUTION) ×3 IMPLANT
PACK LAP CHOLE LZT030E (CUSTOM PROCEDURE TRAY) ×3 IMPLANT
PAD ARMBOARD 7.5X6 YLW CONV (MISCELLANEOUS) ×3 IMPLANT
POUCH SPECIMEN RETRIEVAL 10MM (ENDOMECHANICALS) ×3 IMPLANT
SET BASIN LINEN APH (SET/KITS/TRAYS/PACK) ×3 IMPLANT
SET TUBE IRRIG SUCTION NO TIP (IRRIGATION / IRRIGATOR) IMPLANT
SLEEVE ENDOPATH XCEL 5M (ENDOMECHANICALS) ×3 IMPLANT
SPONGE GAUZE 2X2 8PLY STER LF (GAUZE/BANDAGES/DRESSINGS) ×5
SPONGE GAUZE 2X2 8PLY STRL LF (GAUZE/BANDAGES/DRESSINGS) ×9 IMPLANT
STAPLER VISISTAT (STAPLE) ×3 IMPLANT
SUT VICRYL 0 UR6 27IN ABS (SUTURE) ×3 IMPLANT
TAPE CLOTH SURG 4X10 WHT LF (GAUZE/BANDAGES/DRESSINGS) ×2 IMPLANT
TROCAR ENDO BLADELESS 11MM (ENDOMECHANICALS) ×3 IMPLANT
TROCAR XCEL NON-BLD 5MMX100MML (ENDOMECHANICALS) ×3 IMPLANT
TROCAR XCEL UNIV SLVE 11M 100M (ENDOMECHANICALS) ×3 IMPLANT
TUBING INSUFFLATION (TUBING) ×3 IMPLANT
WARMER LAPAROSCOPE (MISCELLANEOUS) ×3 IMPLANT
YANKAUER SUCT 12FT TUBE ARGYLE (SUCTIONS) ×3 IMPLANT

## 2014-05-27 NOTE — Interval H&P Note (Signed)
History and Physical Interval Note:  05/27/2014 10:44 AM  Diane Hawkins  has presented today for surgery, with the diagnosis of cholelithiasis  The various methods of treatment have been discussed with the patient and family. After consideration of risks, benefits and other options for treatment, the patient has consented to  Procedure(s): LAPAROSCOPIC CHOLECYSTECTOMY (N/A) as a surgical intervention .  The patient's history has been reviewed, patient examined, no change in status, stable for surgery.  I have reviewed the patient's chart and labs.  Questions were answered to the patient's satisfaction.     Aviva Signs A

## 2014-05-27 NOTE — Anesthesia Postprocedure Evaluation (Signed)
  Anesthesia Post-op Note  Patient: Diane Hawkins  Procedure(s) Performed: Procedure(s): LAPAROSCOPIC CHOLECYSTECTOMY (N/A)  Patient Location: PACU  Anesthesia Type:General  Level of Consciousness: awake and alert   Airway and Oxygen Therapy: Patient Spontanous Breathing and Patient connected to face mask oxygen  Post-op Pain: none  Post-op Assessment: Post-op Vital signs reviewed, Patient's Cardiovascular Status Stable, Respiratory Function Stable, Patent Airway and No signs of Nausea or vomiting  Post-op Vital Signs: Reviewed and stable  Last Vitals:  Filed Vitals:   05/27/14 1230  BP:   Temp:   Resp: 22    Complications: No apparent anesthesia complications

## 2014-05-27 NOTE — Interval H&P Note (Signed)
History and Physical Interval Note:  05/27/2014 10:50 AM  Diane Hawkins  has presented today for surgery, with the diagnosis of cholelithiasis  The various methods of treatment have been discussed with the patient and family. After consideration of risks, benefits and other options for treatment, the patient has consented to  Procedure(s): LAPAROSCOPIC CHOLECYSTECTOMY (N/A) as a surgical intervention .  The patient's history has been reviewed, patient examined, no change in status, stable for surgery.  I have reviewed the patient's chart and labs.  Questions were answered to the patient's satisfaction.     Aviva Signs A

## 2014-05-27 NOTE — Addendum Note (Signed)
Addendum  created 05/27/14 1318 by Ollen Bowl, CRNA   Modules edited: Clinical Notes   Clinical Notes:  File: [id number]

## 2014-05-27 NOTE — Anesthesia Preprocedure Evaluation (Addendum)
Anesthesia Evaluation   Patient awake    Reviewed: Allergy & Precautions, H&P , NPO status , Patient's Chart, lab work & pertinent test results, reviewed documented beta blocker date and time   History of Anesthesia Complications Negative for: history of anesthetic complications  Airway Mallampati: III  TM Distance: <3 FB Neck ROM: Full   Comment: 05/04/2014  Was difficult to view posterior arytenoids with mac 3, would recommend miller blade or possibly video laryngoscopy. Dental  (+) Teeth Intact, Implants, Dental Advisory Given   Pulmonary Current Smoker, former smoker,  breath sounds clear to auscultation        Cardiovascular Exercise Tolerance: Good hypertension, Pt. on medications and Pt. on home beta blockers Rhythm:Regular Rate:Normal  Coreg this am   Neuro/Psych    GI/Hepatic Neg liver ROS, GERD-  Medicated and Controlled,  Endo/Other  diabetes, Well Controlled, Type 1  Renal/GU negative Renal ROS     Musculoskeletal   Abdominal   Peds  Hematology   Anesthesia Other Findings   Reproductive/Obstetrics                            Anesthesia Physical Anesthesia Plan  ASA: III  Anesthesia Plan: General   Post-op Pain Management:    Induction: Intravenous, Rapid sequence and Cricoid pressure planned  Airway Management Planned: Oral ETT and Video Laryngoscope Planned  Additional Equipment:   Intra-op Plan:   Post-operative Plan: Extubation in OR  Informed Consent: I have reviewed the patients History and Physical, chart, labs and discussed the procedure including the risks, benefits and alternatives for the proposed anesthesia with the patient or authorized representative who has indicated his/her understanding and acceptance.     Plan Discussed with: Anesthesiologist  Anesthesia Plan Comments: (Case discussed with Dr. Patsey Berthold via telephone.  Agreeable to GOT/RSI.)        Anesthesia Quick Evaluation

## 2014-05-27 NOTE — Anesthesia Procedure Notes (Signed)
Procedure Name: Intubation Date/Time: 05/27/2014 11:39 AM Performed by: Tressie Stalker E Pre-anesthesia Checklist: Patient identified, Patient being monitored, Timeout performed, Emergency Drugs available and Suction available Patient Re-evaluated:Patient Re-evaluated prior to inductionOxygen Delivery Method: Circle System Utilized Preoxygenation: Pre-oxygenation with 100% oxygen Intubation Type: IV induction, Rapid sequence and Cricoid Pressure applied Ventilation: Mask ventilation without difficulty Laryngoscope Size: Mac and 3 Grade View: Grade I Tube type: Oral Tube size: 7.0 mm Number of attempts: 1 Airway Equipment and Method: stylet and Video-laryngoscopy Placement Confirmation: ETT inserted through vocal cords under direct vision,  positive ETCO2 and breath sounds checked- equal and bilateral Secured at: 21 cm Tube secured with: Tape Dental Injury: Teeth and Oropharynx as per pre-operative assessment

## 2014-05-27 NOTE — Op Note (Signed)
Patient:  Diane Hawkins  DOB:  [birth year]  MRN:  [id number]   Preop Diagnosis:  Cholecystitis, cholelithiasis  Postop Diagnosis:  Same  Procedure:  Laparoscopic cholecystectomy  Surgeon:  Aviva Signs, M.D.  Anes:  Gen. endotracheal  Indications:  Patient is a 73 year old white female status post an ERCP for choledocholithiasis now presents for laparoscopic cholecystectomy. The risks and benefits of the procedure including bleeding, infection, hepatobiliary injury, and the possibility of an open procedure were fully explained to the patient, who gave informed consent.  Procedure note:  The patient was placed the supine position. After induction of general endotracheal anesthesia, the abdomen was prepped and draped using usual sterile technique with DuraPrep. Surgical site confirmation was performed.  The supraumbilical incision was made down to the fascia. A Veress needle was introduced into the abdominal cavity and confirmation of placement was done using the saline drop test. The abdomen was then insufflated to 16 mmHg pressure. An 11 mm trocar was introduced into the abdominal cavity under direct visualization without difficulty. The patient was placed in reverse Trendelenburg position and additional 11 mm trocar was placed the epigastric region and 5 mm trochars were placed the right upper quadrant and right flank regions. The liver was inspected and noted within normal limits. The gallbladder was retracted in a dynamic fashion in order to expose the triangle of Calot. The cystic duct was first identified. Its junction to the infundibulum was fully identified. Endoclips were placed proximally distally on the cystic duct, and the cystic duct was divided. This was likewise done cystic artery. The gallbladder was freed away from the gallbladder fossa using Bovie electrocautery. The gallbladder was delivered through the epigastric trocar site using an Endo Catch bag. The gallbladder fossa was  inspected and no abnormal bleeding or bile leakage was noted. Surgicel is placed the gallbladder fossa. All fluid and air were then evacuated from the abdominal cavity prior to removal of the trochars.  All wounds were irrigated with normal saline. All wounds were injected with 0.5% Sensorcaine. The supraumbilical fascia was reapproximated using an 0 Vicryl interrupted suture. All skin incisions were closed using staples. Betadine ointment and dry sterile dressings were applied.  All tape and needle counts were correct the end of the procedure. Patient was extubated in the operating room and transferred to PACU in stable condition.  Complications:  None  EBL:  Minimal  Specimen:  Gallbladder

## 2014-05-27 NOTE — Discharge Instructions (Signed)

## 2014-05-27 NOTE — Transfer of Care (Signed)
Immediate Anesthesia Transfer of Care Note  Patient: Diane Hawkins  Procedure(s) Performed: Procedure(s): LAPAROSCOPIC CHOLECYSTECTOMY (N/A)  Patient Location: PACU  Anesthesia Type:General  Level of Consciousness: awake  Airway & Oxygen Therapy: Patient Spontanous Breathing  Post-op Assessment: Report given to PACU RN  Post vital signs: Reviewed  Complications: No apparent anesthesia complications

## 2014-05-28 ENCOUNTER — Encounter (HOSPITAL_COMMUNITY): Payer: Self-pay | Admitting: General Surgery

## 2014-05-28 ENCOUNTER — Ambulatory Visit: Payer: PRIVATE HEALTH INSURANCE | Admitting: Internal Medicine

## 2014-06-07 ENCOUNTER — Encounter: Payer: Self-pay | Admitting: Internal Medicine

## 2014-06-07 ENCOUNTER — Ambulatory Visit (INDEPENDENT_AMBULATORY_CARE_PROVIDER_SITE_OTHER): Payer: Medicare Other | Admitting: Internal Medicine

## 2014-06-07 ENCOUNTER — Ambulatory Visit (HOSPITAL_COMMUNITY)
Admission: RE | Admit: 2014-06-07 | Discharge: 2014-06-07 | Disposition: A | Discharge status: Home / Self Care | Payer: Medicare Other | Source: Ambulatory Visit | Attending: Internal Medicine | Admitting: Internal Medicine

## 2014-06-07 ENCOUNTER — Other Ambulatory Visit: Payer: Self-pay

## 2014-06-07 ENCOUNTER — Telehealth: Payer: Self-pay

## 2014-06-07 VITALS — BP 150/56 | HR 76 | Temp 96.9°F | Ht 63.0 in | Wt 148.6 lb

## 2014-06-07 DIAGNOSIS — J988 Other specified respiratory disorders: Secondary | ICD-10-CM

## 2014-06-07 DIAGNOSIS — R0602 Shortness of breath: Secondary | ICD-10-CM

## 2014-06-07 DIAGNOSIS — R079 Chest pain, unspecified: Secondary | ICD-10-CM

## 2014-06-07 DIAGNOSIS — R05 Cough: Secondary | ICD-10-CM | POA: Diagnosis not present

## 2014-06-07 DIAGNOSIS — K5289 Other specified noninfective gastroenteritis and colitis: Secondary | ICD-10-CM

## 2014-06-07 DIAGNOSIS — J22 Unspecified acute lower respiratory infection: Secondary | ICD-10-CM

## 2014-06-07 DIAGNOSIS — K52832 Lymphocytic colitis: Secondary | ICD-10-CM

## 2014-06-07 NOTE — Telephone Encounter (Signed)
I called [employer] and spoke with [place]sel[place] Dezza and called in the Levaquin 500 mg one daily x 10 days with no refills.

## 2014-06-07 NOTE — Progress Notes (Signed)
Primary Care Physician:  Clinton Quant, MD Primary Gastroenterologist:  Dr. Gala Romney Pre-Procedure History & Physical: HPI:  Diane Hawkins is a 74 y.o. female here for for GI follow-up . Recent problems include biopsy-proven lymphocytic colitis along with small adenoma removed from her colon. Diarrhea has responded nicely to Uceris. Currently resolved. Unfortunately, in the interim, she developed yet another new GI probably  - that of a common duct stone for which she underwent sphincterotomy and stone extraction by Dr. Laural Golden. She recovered nicely from episode. Most recently, she is status post laparoscopic cholecystectomy by Dr. Arnoldo Morale. She's had an uneventful postoperative course having only occasional diarrhea for which she takes Imodium;  she is to finish Uceris on the 21st of this month. For the past 5 days, she had a productive cough, some pleuritic chest discomfort and mild shortness of breath. She does have a history of cigar and cigarette smoking. Has not seen her primary care physician as of yet. Symptoms started 5 days ago -  they have not progressed but have not improved either. She denies fever or chills.  Of note, Dr. Arnoldo Morale obtained a KUB to assess for pancreatic duct stent persistence, there was no evidence of any persisting stent on this imaging study (patient did not notice stent passage)  Past Medical History  Diagnosis Date  . Diabetes   . Hypertension   . Hypercholesterolemia   . GERD (gastroesophageal reflux disease)   . Back pain   . Scoliosis   . Glaucoma   . Lymphocytic colitis 2015  . Tubular adenoma 2015    Past Surgical History  Procedure Laterality Date  . Back surgery      spinal stenosis  . Colonoscopy  Oct 2012    Dr. Algis Greenhouse: few small rectal polyps s/p biopsy, adenomatous polyps  . Colonoscopy N/A 04/09/2014    Dr.Arian Murley- colonic diverticulosis,polyp removed, subtly abnormal colonic mucosa bx=lymphocytic colitis and tubular adenoma  . Ercp  N/A 05/04/2014    Dr. Laural Golden- cystic duct was patent, biliary sphincterotomy performed and single pigmented stome removed with stone balloon extractor  . Sphincterotomy N/A 05/04/2014    Procedure: SPHINCTEROTOMY;  Surgeon: Rogene Houston, MD;  Location: [facility];  Service: Endoscopy;  Laterality: N/A;  . Removal of stones N/A 05/04/2014    Procedure: REMOVAL OF STONES;  Surgeon: Rogene Houston, MD;  Location: [facility];  Service: Endoscopy;  Laterality: N/A;  . Balloon dilation N/A 05/04/2014    Procedure: BALLOON DILATION;  Surgeon: Rogene Houston, MD;  Location: [facility];  Service: Endoscopy;  Laterality: N/A;  . Tonsillectomy      age 59  . Ganglion cyst excision Right   . Cataract extraction, bilateral    . Abdominal hysterectomy      and bladder tac  . Cholecystectomy N/A 05/27/2014    Procedure: LAPAROSCOPIC CHOLECYSTECTOMY;  Surgeon: Jamesetta So, MD;  Location: [facility];  Service: General;  Laterality: N/A;    Prior to Admission medications   Medication Sig Start Date End Date Taking? Authorizing Provider  acetaminophen (TYLENOL) 500 MG tablet Take 1,000 mg by mouth every 6 (six) hours as needed for mild pain.   Yes Historical Provider, MD  amLODipine (NORVASC) 5 MG tablet Take 5 mg by mouth daily.   Yes Historical Provider, MD  aspirin 500 MG buffered tablet Take 500 mg by mouth daily.   Yes Historical Provider, MD  Budesonide (UCERIS) 9 MG TB24 Take 1 capsule by mouth daily. 04/19/14  Yes Orvil Feil, NP  carvedilol (COREG) 12.5 MG tablet Take 12.5 mg by mouth 2 (two) times daily.   Yes Historical Provider, MD  Cholecalciferol (VITAMIN D PO) Take 1,000 Units by mouth.   Yes Historical Provider, MD  Esomeprazole Magnesium (NEXIUM PO) Take 1 capsule by mouth daily.    Yes Historical Provider, MD  Insulin Aspart Prot & Aspart (NOVOLOG MIX 70/30 [place]) Inject into the skin. 25 units in the AM 10 units in the PM   Yes Historical Provider, MD  Loperamide HCl (IMODIUM PO) Take 1 tablet by mouth  daily as needed (diarrhea).    Yes Historical Provider, MD  LORazepam (ATIVAN) 0.5 MG tablet Take 0.5 mg by mouth daily as needed for anxiety.    Yes Historical Provider, MD  Probiotic Product (PROBIOTIC DAILY PO) Take 1 tablet by mouth daily.   Yes Historical Provider, MD  rosuvastatin (CRESTOR) 10 MG tablet Take 10 mg by mouth daily.   Yes Historical Provider, MD  traMADol (ULTRAM) 50 MG tablet Take 1 tablet (50 mg total) by mouth every 6 (six) hours as needed. 05/27/14  Yes Jamesetta So, MD  Probiotic Product (RESTORA PO) Take 1 capsule by mouth daily.    Historical Provider, MD    Allergies as of 06/07/2014 - Review Complete 06/07/2014  Allergen Reaction Noted  . Codeine  04/03/2014  . Erythromycin Nausea And Vomiting 04/03/2014    Family History  Problem Relation Age of Onset  . Colon cancer Neg Hx   . Diverticulitis Mother   . Ulcers Mother     History   Social History  . Marital Status: Married    Spouse Name: N/A    Number of Children: N/A  . Years of Education: N/A   Occupational History  . Retired Therapist, sports    Social History Main Topics  . Smoking status: Former Smoker -- 0.50 packs/day for 25 years    Types: E-cigarettes    Quit date: 02/20/2005  . Smokeless tobacco: Not on file     Comment: E-Cigarette  . Alcohol Use: No  . Drug Use: No  . Sexual Activity: Yes    Birth Control/ Protection: Surgical   Other Topics Concern  . Not on file   Social History Narrative    Review of Systems: See HPI, otherwise negative ROS  Physical Exam: BP 150/56 mmHg  Pulse 76  Temp(Src) 96.9 F (36.1 C)  Ht 5\' 3"  (1.6 m)  Wt 148 lb 9.6 oz (67.405 kg)  BMI 26.33 kg/m2 General:   Alert,  , pleasant and cooperative in NAD.  However, does not appear to feel very well. She is not obviously dyspneic.  She is not using accessory muscles. Skin:  Intact without significant lesions or rashes. Eyes:  Sclera clear, no icterus.   Conjunctiva pink. Ears:  Normal auditory  acuity. Nose:  No deformity, discharge,  or lesions. Mouth:  No deformity or lesions. Neck:  Supple; no masses or thyromegaly. No significant cervical adenopathy. Lungs:  Good air movement maintained. She does have some rhonchi throughout both lung fields but worse throughout the left lung field diffusely. No wheezing. Heart:  Regular rate and rhythm; no murmurs, clicks, rubs,  or gallops. Abdomen: Non-distended, laparoscopy port sites appear to be healing well. normal bowel sounds.  Soft and nontender without appreciable mass or hepatosplenomegaly.  Pulses:  Normal pulses noted. Extremities:  Without clubbing or edema.  Impression:  Very pleasant lady recently diagnosed with microscopic colitis - symptoms  responsive to budesonide therapy orally. Unfortunately, she had additional GI issues arise including common duct stone and cholecystitis requiring ERCP with stone extraction and cholecystectomy.  GI issues are resolving.  Likely has acquired bronchitis and/or  possibly pneumonia over the past week.  She is not in respiratory distress and does not appear toxic at this time.  Recommendations:  Continue Uceris 9 mg daily until the 21st; then decrease to 1 tablet every other day for a subsequent month;  then stop this medication (samples provided).  May use Imodium as needed for diarrhea  Levaquin 500 mg daily for 10 days  Chest x-ray today  If worsening shortness of breath or chest pain, go to the emergency department  Follow-up with primary care doctor,  Dr. Harmon Pier in the next one to 2 weeks  Office visit here in 43 weeks.      Notice: This dictation was prepared with Dragon dictation along with smaller phrase technology. Any transcriptional errors that result from this process are unintentional and may not be corrected upon review.

## 2014-06-07 NOTE — Patient Instructions (Signed)
Continue Uceris 9 mg daily until the 21st; then decrease to 1 tablet every other day for a month then stop this medication (samples provided).  May use Imodium as needed for diarrhea  Levaquin 500 mg daily for 10 days  Chest x-ray today  If worsening shortness of breath or chest pain, go to the emergency department  Follow-up with primary care doctor,  Dr. Harmon Pier in the next one to 2 weeks  Office visit here in 75 weeks.

## 2014-06-09 ENCOUNTER — Encounter (HOSPITAL_COMMUNITY): Payer: Self-pay | Admitting: Emergency Medicine

## 2014-06-09 ENCOUNTER — Emergency Department (HOSPITAL_COMMUNITY): Payer: Medicare Other

## 2014-06-09 ENCOUNTER — Emergency Department (HOSPITAL_COMMUNITY)
Admission: EM | Admit: 2014-06-09 | Discharge: 2014-06-09 | Disposition: A | Discharge status: Home / Self Care | Payer: Medicare Other | Attending: Emergency Medicine | Admitting: Emergency Medicine

## 2014-06-09 DIAGNOSIS — Z8739 Personal history of other diseases of the musculoskeletal system and connective tissue: Secondary | ICD-10-CM | POA: Diagnosis not present

## 2014-06-09 DIAGNOSIS — J069 Acute upper respiratory infection, unspecified: Secondary | ICD-10-CM | POA: Insufficient documentation

## 2014-06-09 DIAGNOSIS — Z794 Long term (current) use of insulin: Secondary | ICD-10-CM | POA: Insufficient documentation

## 2014-06-09 DIAGNOSIS — Z87891 Personal history of nicotine dependence: Secondary | ICD-10-CM | POA: Diagnosis not present

## 2014-06-09 DIAGNOSIS — I10 Essential (primary) hypertension: Secondary | ICD-10-CM | POA: Insufficient documentation

## 2014-06-09 DIAGNOSIS — K219 Gastro-esophageal reflux disease without esophagitis: Secondary | ICD-10-CM | POA: Insufficient documentation

## 2014-06-09 DIAGNOSIS — Z8669 Personal history of other diseases of the nervous system and sense organs: Secondary | ICD-10-CM | POA: Diagnosis not present

## 2014-06-09 DIAGNOSIS — E119 Type 2 diabetes mellitus without complications: Secondary | ICD-10-CM | POA: Diagnosis not present

## 2014-06-09 DIAGNOSIS — Z79899 Other long term (current) drug therapy: Secondary | ICD-10-CM | POA: Insufficient documentation

## 2014-06-09 DIAGNOSIS — R0602 Shortness of breath: Secondary | ICD-10-CM | POA: Diagnosis present

## 2014-06-09 DIAGNOSIS — Z862 Personal history of diseases of the blood and blood-forming organs and certain disorders involving the immune mechanism: Secondary | ICD-10-CM | POA: Diagnosis not present

## 2014-06-09 LAB — COMPREHENSIVE METABOLIC PANEL
ALBUMIN: 3.4 g/dL — AB (ref 3.5–5.2)
ALK PHOS: 85 U/L (ref 39–117)
ALT: 16 U/L (ref 0–35)
AST: 18 U/L (ref 0–37)
Anion gap: 8 (ref 5–15)
BUN: 10 mg/dL (ref 6–23)
CALCIUM: 8.7 mg/dL (ref 8.4–10.5)
CHLORIDE: 105 meq/L (ref 96–112)
CO2: 26 mmol/L (ref 19–32)
CREATININE: 1.02 mg/dL (ref 0.50–1.10)
GFR calc Af Amer: 62 mL/min — ABNORMAL LOW (ref >90)
GFR calc non Af Amer: 54 mL/min — ABNORMAL LOW (ref >90)
Glucose, Bld: 159 mg/dL — ABNORMAL HIGH (ref 70–99)
POTASSIUM: 3.3 mmol/L — AB (ref 3.5–5.1)
Sodium: 139 mmol/L (ref 135–145)
Total Bilirubin: 0.6 mg/dL (ref 0.3–1.2)
Total Protein: 6.5 g/dL (ref 6.0–8.3)

## 2014-06-09 LAB — CBC WITH DIFFERENTIAL/PLATELET
BASOS PCT: 1 % (ref 0–1)
Basophils Absolute: 0 10*3/uL (ref 0.0–0.1)
EOS ABS: 0.1 10*3/uL (ref 0.0–0.7)
EOS PCT: 2 % (ref 0–5)
HCT: 37.7 % (ref 36.0–46.0)
Hemoglobin: 12.1 g/dL (ref 12.0–15.0)
Lymphocytes Relative: 27 % (ref 12–46)
Lymphs Abs: 1.5 10*3/uL (ref 0.7–4.0)
MCH: 27.4 pg (ref 26.0–34.0)
MCHC: 32.1 g/dL (ref 30.0–36.0)
MCV: 85.3 fL (ref 78.0–100.0)
MONOS PCT: 6 % (ref 3–12)
Monocytes Absolute: 0.4 10*3/uL (ref 0.1–1.0)
Neutro Abs: 3.7 10*3/uL (ref 1.7–7.7)
Neutrophils Relative %: 64 % (ref 43–77)
PLATELETS: 183 10*3/uL (ref 150–400)
RBC: 4.42 MIL/uL (ref 3.87–5.11)
RDW: 14.3 % (ref 11.5–15.5)
WBC: 5.7 10*3/uL (ref 4.0–10.5)

## 2014-06-09 LAB — CBG MONITORING, ED: Glucose-Capillary: 172 mg/dL — ABNORMAL HIGH (ref 70–99)

## 2014-06-09 LAB — TROPONIN I: Troponin I: <0.03 ng/mL (ref <0.031)

## 2014-06-09 LAB — D-DIMER, QUANTITATIVE (NOT AT ARMC): D DIMER QUANT: 0.59 ug{FEU}/mL — AB (ref 0.00–0.48)

## 2014-06-09 MED ORDER — ALBUTEROL SULFATE (2.5 MG/3ML) 0.083% IN NEBU
2.5000 mg | INHALATION_SOLUTION | Freq: Once | RESPIRATORY_TRACT | Status: AC
  Administered 2014-06-09: 2.5 mg via RESPIRATORY_TRACT
  Filled 2014-06-09: qty 3

## 2014-06-09 MED ORDER — IPRATROPIUM-ALBUTEROL 0.5-2.5 (3) MG/3ML IN SOLN
3.0000 mL | Freq: Once | RESPIRATORY_TRACT | Status: AC
  Administered 2014-06-09: 3 mL via RESPIRATORY_TRACT
  Filled 2014-06-09: qty 3

## 2014-06-09 MED ORDER — LEVOFLOXACIN 500 MG PO TABS
500.0000 mg | ORAL_TABLET | Freq: Once | ORAL | Status: AC
  Administered 2014-06-09: 500 mg via ORAL
  Filled 2014-06-09: qty 1

## 2014-06-09 MED ORDER — ALBUTEROL SULFATE HFA 108 (90 BASE) MCG/ACT IN AERS
2.0000 | INHALATION_SPRAY | Freq: Once | RESPIRATORY_TRACT | Status: AC
  Administered 2014-06-09: 2 via RESPIRATORY_TRACT
  Filled 2014-06-09: qty 6.7

## 2014-06-09 NOTE — Telephone Encounter (Signed)
Chest x-ray reviewed. I called patient and informed her that the chest x-ray looks stable with no pneumonia or other acute process seen. She was seen in the office 2 days ago. Clinically, had  a lower respiratory tract infection. Levaquin prescribed. She has taken 2 doses. She tells me that she has a little bit of a dry cough/ not productive.  Still having shortness of breath -  hard to lie flat sleeps upright in a chair and feels most comfortable somewhat leaning forward. No fever. Abdomen feels "swollen". Denies any degree of abdominal pain, however. No fever or chills. States she is no worse than when seen on Friday but no better either.  With this progress report, I advised her to go here to the emergency department to be evaluated for other causes of dyspnea i.e. Pulmonary embolus occult heart disease, etc. She agreed. She is coming to [facility]. I called Dr. Wyvonnia Dusky and advised him of her imminent visit.

## 2014-06-09 NOTE — Discharge Instructions (Signed)
Chest x-ray showed no pneumonia. Continue antibiotic. Usually inhaler 2 puffs every 3 hours. Increase fluids. Follow-up your primary care doctor.

## 2014-06-09 NOTE — ED Notes (Signed)
CBG 172

## 2014-06-09 NOTE — ED Notes (Signed)
Patient c/o shortness of breath since Tuesday. Denies any chest pain. Per patient had common cold Tuesday. Denies any hx of shortness of breath/COPD. Patient reports healing from 2 surgeries in this month. Patient told Dr Sydell Axon about shortness of breath and Dr Sydell Axon wanted patient to be seen.

## 2014-06-09 NOTE — ED Notes (Signed)
Pt states she has not taken insulin today due to not eating all day

## 2014-06-09 NOTE — ED Provider Notes (Signed)
CSN: [id number]     Arrival date & time 06/09/14  1433 History   First MD Initiated Contact with Patient 06/09/14 1506     Chief Complaint  Patient presents with  . Shortness of Breath     (Consider location/radiation/quality/duration/timing/severity/associated sxs/prior Treatment) HPI.... URI symptoms for 1 week with associated wheezing, low-grade fever, dyspnea. Patient seen by primary care physician and put on Levaquin. Recent chest x-ray negative. Nonsmoker.  Patient is dyspneic with exertion. No substernal chest pain. She is noticeably wheezing.  Past Medical History  Diagnosis Date  . Diabetes   . Hypertension   . Hypercholesterolemia   . GERD (gastroesophageal reflux disease)   . Back pain   . Scoliosis   . Glaucoma   . Lymphocytic colitis 2015  . Tubular adenoma 2015   Past Surgical History  Procedure Laterality Date  . Back surgery      spinal stenosis  . Colonoscopy  Oct 2012    Dr. Algis Greenhouse: few small rectal polyps s/p biopsy, adenomatous polyps  . Colonoscopy N/A 04/09/2014    Dr.Rourk- colonic diverticulosis,polyp removed, subtly abnormal colonic mucosa bx=lymphocytic colitis and tubular adenoma  . Ercp N/A 05/04/2014    Dr. Laural Golden- cystic duct was patent, biliary sphincterotomy performed and single pigmented stome removed with stone balloon extractor  . Sphincterotomy N/A 05/04/2014    Procedure: SPHINCTEROTOMY;  Surgeon: Rogene Houston, MD;  Location: [facility];  Service: Endoscopy;  Laterality: N/A;  . Removal of stones N/A 05/04/2014    Procedure: REMOVAL OF STONES;  Surgeon: Rogene Houston, MD;  Location: [facility];  Service: Endoscopy;  Laterality: N/A;  . Balloon dilation N/A 05/04/2014    Procedure: BALLOON DILATION;  Surgeon: Rogene Houston, MD;  Location: [facility];  Service: Endoscopy;  Laterality: N/A;  . Tonsillectomy      age 72  . Ganglion cyst excision Right   . Cataract extraction, bilateral    . Abdominal hysterectomy      and bladder tac  .  Cholecystectomy N/A 05/27/2014    Procedure: LAPAROSCOPIC CHOLECYSTECTOMY;  Surgeon: Jamesetta So, MD;  Location: [facility];  Service: General;  Laterality: N/A;  . Gallbladder surgery     Family History  Problem Relation Age of Onset  . Colon cancer Neg Hx   . Diverticulitis Mother   . Ulcers Mother    History  Substance Use Topics  . Smoking status: Former Smoker -- 0.50 packs/day for 25 years    Types: E-cigarettes    Quit date: 02/20/2005  . Smokeless tobacco: Never Used     Comment: E-Cigarette  . Alcohol Use: No   OB History    Gravida Para Term Preterm AB TAB SAB Ectopic Multiple Living   1 1 1       1      Review of Systems  All other systems reviewed and are negative.     Allergies  Codeine and Erythromycin  Home Medications   Prior to Admission medications   Medication Sig Start Date End Date Taking? Authorizing Provider  acetaminophen (TYLENOL) 500 MG tablet Take 1,000 mg by mouth every 6 (six) hours as needed for mild pain.    Historical Provider, MD  amLODipine (NORVASC) 5 MG tablet Take 5 mg by mouth daily.    Historical Provider, MD  aspirin 500 MG buffered tablet Take 500 mg by mouth daily.    Historical Provider, MD  Budesonide (UCERIS) 9 MG TB24 Take 1 capsule by mouth daily. 04/19/14  Orvil Feil, NP  carvedilol (COREG) 12.5 MG tablet Take 12.5 mg by mouth 2 (two) times daily.    Historical Provider, MD  Cholecalciferol (VITAMIN D PO) Take 1,000 Units by mouth.    Historical Provider, MD  Esomeprazole Magnesium (NEXIUM PO) Take 1 capsule by mouth daily.     Historical Provider, MD  Insulin Aspart Prot & Aspart (NOVOLOG MIX 70/30 [place]) Inject into the skin. 25 units in the AM 10 units in the PM    Historical Provider, MD  Loperamide HCl (IMODIUM PO) Take 1 tablet by mouth daily as needed (diarrhea).     Historical Provider, MD  LORazepam (ATIVAN) 0.5 MG tablet Take 0.5 mg by mouth daily as needed for anxiety.     Historical Provider, MD  Probiotic  Product (PROBIOTIC DAILY PO) Take 1 tablet by mouth daily.    Historical Provider, MD  Probiotic Product (RESTORA PO) Take 1 capsule by mouth daily.    Historical Provider, MD  rosuvastatin (CRESTOR) 10 MG tablet Take 10 mg by mouth daily.    Historical Provider, MD  traMADol (ULTRAM) 50 MG tablet Take 1 tablet (50 mg total) by mouth every 6 (six) hours as needed. 05/27/14   Jamesetta So, MD   BP 104/40 mmHg  Pulse 73  Temp(Src) 98.4 F (36.9 C) (Tympanic)  Resp 22  Ht 5' 3.5" (1.613 m)  Wt 148 lb (67.132 kg)  BMI 25.80 kg/m2  SpO2 92% Physical Exam  Constitutional: She is oriented to person, place, and time. She appears well-developed and well-nourished.  Pleasant, conversant, no dyspnea or tachypnea  HENT:  Head: Normocephalic and atraumatic.  Eyes: Conjunctivae and EOM are normal. Pupils are equal, round, and reactive to light.  Neck: Normal range of motion. Neck supple.  Cardiovascular: Normal rate and regular rhythm.   Pulmonary/Chest: Effort normal.  Significant bilateral expiratory wheezes  Abdominal: Soft. Bowel sounds are normal.  Musculoskeletal: Normal range of motion.  Neurological: She is alert and oriented to person, place, and time.  Skin: Skin is warm and dry.  Psychiatric: She has a normal mood and affect. Her behavior is normal.  Nursing note and vitals reviewed.   ED Course  Procedures (including critical care time) Labs Review Labs Reviewed  COMPREHENSIVE METABOLIC PANEL - Abnormal; Notable for the following:    Potassium 3.3 (*)    Glucose, Bld 159 (*)    Albumin 3.4 (*)    GFR calc non Af Amer 54 (*)    GFR calc Af Amer 62 (*)    All other components within normal limits  D-DIMER, QUANTITATIVE - Abnormal; Notable for the following:    D-Dimer, Quant 0.59 (*)    All other components within normal limits  CBG MONITORING, ED - Abnormal; Notable for the following:    Glucose-Capillary 172 (*)    All other components within normal limits  CBC WITH  DIFFERENTIAL  TROPONIN I    Imaging Review Dg Chest 2 View  06/09/2014   CLINICAL DATA:  Shortness of breath  EXAM: CHEST  2 VIEW  COMPARISON:  06/07/2014  FINDINGS: Lungs are clear.  No pleural effusion or pneumothorax.  Mild eventration right hemidiaphragm.  The heart is normal in size.  Visualized osseous structures are within normal limits.  IMPRESSION: No evidence of acute cardiopulmonary disease.   Electronically Signed   By: Julian Hy M.D.   On: 06/09/2014 16:01     EKG Interpretation   Date/Time:  Sunday June 09 2014  14:59:10 EST Ventricular Rate:  70 PR Interval:  173 QRS Duration: 93 QT Interval:  417 QTC Calculation: 450 R Axis:   65 Text Interpretation:  Sinus rhythm Baseline wander in lead(s) V3 Confirmed  by ZAMMIT  MD, JOSEPH ([id number]) on 06/09/2014 3:02:23 PM      MDM   Final diagnoses:  URI (upper respiratory infection)    History and physical consistent with upper respiratory infection. Patient feels better after Albuterol/Atrovent nebulizer treatment. Discharge pulse ox 96%. Blood pressure and pulse normal.  Continue Levaquin. Add albuterol inhaler   Nat Christen, MD 06/09/14 1711

## 2014-06-09 NOTE — ED Notes (Signed)
Dr. Wyvonnia Dusky made aware that patient has arrived in ED waiting room

## 2014-08-01 ENCOUNTER — Encounter: Payer: Self-pay | Admitting: Internal Medicine

## 2014-09-06 ENCOUNTER — Ambulatory Visit (INDEPENDENT_AMBULATORY_CARE_PROVIDER_SITE_OTHER): Payer: Medicare Other | Admitting: Internal Medicine

## 2014-09-06 ENCOUNTER — Encounter: Payer: Self-pay | Admitting: Internal Medicine

## 2014-09-06 VITALS — BP 135/67 | HR 84 | Temp 98.0°F | Ht 63.0 in | Wt 152.4 lb

## 2014-09-06 DIAGNOSIS — K5289 Other specified noninfective gastroenteritis and colitis: Secondary | ICD-10-CM | POA: Diagnosis not present

## 2014-09-06 DIAGNOSIS — K52839 Microscopic colitis, unspecified: Secondary | ICD-10-CM

## 2014-09-06 DIAGNOSIS — K219 Gastro-esophageal reflux disease without esophagitis: Secondary | ICD-10-CM

## 2014-09-06 NOTE — Progress Notes (Signed)
Primary Care Physician:  Clinton Quant, MD Primary Gastroenterologist:  Dr. Gala Romney  Pre-Procedure History & Physical: HPI:  Diane Hawkins is a 74 y.o. female here for followup of lymphocytic colitis. Recent cholecystectomy ERCP with sphincterotomy for stone disease. Pancreatic duct stent noted in the past with subsequent KUB. Finish budesonide earlier in the year. Baseline now is occasional episodes of diarrhea weekly she takes Imodium if she has to go out. Only one bout of incontinence. States she is not back to her baseline however much better than she was pre-treatment for colitis. It has been exposed antibiotics for bronchitis recently also has diabetes-fairly well controlled and, of course, status post cholecystectomy.  Reflux symptoms well controlled on OTC Nexium daily when necessary. No dysphagia or melena.  Past Medical History  Diagnosis Date  . Diabetes   . Hypertension   . Hypercholesterolemia   . GERD (gastroesophageal reflux disease)   . Back pain   . Scoliosis   . Glaucoma   . Lymphocytic colitis 2015  . Tubular adenoma 2015    Past Surgical History  Procedure Laterality Date  . Back surgery      spinal stenosis  . Colonoscopy  Oct 2012    Dr. Algis Greenhouse: few small rectal polyps s/p biopsy, adenomatous polyps  . Colonoscopy N/A 04/09/2014    Dr.Vinaya Sancho- colonic diverticulosis,polyp removed, subtly abnormal colonic mucosa bx=lymphocytic colitis and tubular adenoma  . Ercp N/A 05/04/2014    Dr. Laural Golden- cystic duct was patent, biliary sphincterotomy performed and single pigmented stome removed with stone balloon extractor  . Sphincterotomy N/A 05/04/2014    Procedure: SPHINCTEROTOMY;  Surgeon: Rogene Houston, MD;  Location: [facility];  Service: Endoscopy;  Laterality: N/A;  . Removal of stones N/A 05/04/2014    Procedure: REMOVAL OF STONES;  Surgeon: Rogene Houston, MD;  Location: [facility];  Service: Endoscopy;  Laterality: N/A;  . Balloon dilation N/A 05/04/2014   Procedure: BALLOON DILATION;  Surgeon: Rogene Houston, MD;  Location: [facility];  Service: Endoscopy;  Laterality: N/A;  . Tonsillectomy      age 9  . Ganglion cyst excision Right   . Cataract extraction, bilateral    . Abdominal hysterectomy      and bladder tac  . Cholecystectomy N/A 05/27/2014    Procedure: LAPAROSCOPIC CHOLECYSTECTOMY;  Surgeon: Jamesetta So, MD;  Location: [facility];  Service: General;  Laterality: N/A;  . Gallbladder surgery      Prior to Admission medications   Medication Sig Start Date End Date Taking? Authorizing Provider  acetaminophen (TYLENOL) 500 MG tablet Take 1,000 mg by mouth every 6 (six) hours as needed for mild pain.   Yes Historical Provider, MD  amLODipine (NORVASC) 5 MG tablet Take 5 mg by mouth daily.   Yes Historical Provider, MD  aspirin 500 MG tablet Take 500 mg by mouth every 6 (six) hours as needed for pain.   Yes Historical Provider, MD  carvedilol (COREG) 12.5 MG tablet Take 12.5 mg by mouth 2 (two) times daily.   Yes Historical Provider, MD  Cholecalciferol (VITAMIN D PO) Take 1,000 Units by mouth.   Yes Historical Provider, MD  Esomeprazole Magnesium (NEXIUM PO) Take 23 mg by mouth daily.    Yes Historical Provider, MD  Insulin Aspart Prot & Aspart (NOVOLOG MIX 70/30 [place]) Inject into the skin. 30 units in the AM 20 units in the PM   Yes Historical Provider, MD  Loperamide HCl (IMODIUM PO) Take 1 tablet by  mouth daily as needed (diarrhea).    Yes Historical Provider, MD  LORazepam (ATIVAN) 0.5 MG tablet Take 0.5 mg by mouth daily as needed for anxiety.    Yes Historical Provider, MD  Probiotic Product (PROBIOTIC DAILY PO) Take 1 tablet by mouth daily.   Yes Historical Provider, MD  rosuvastatin (CRESTOR) 10 MG tablet Take 10 mg by mouth daily.   Yes Historical Provider, MD  aspirin 500 MG buffered tablet Take 500 mg by mouth daily.    Historical Provider, MD  Budesonide (UCERIS) 9 MG TB24 Take 1 capsule by mouth daily. Patient not taking:  Reported on 09/06/2014 04/19/14   Orvil Feil, NP  Probiotic Product (RESTORA PO) Take 1 capsule by mouth daily.    Historical Provider, MD  traMADol (ULTRAM) 50 MG tablet Take 1 tablet (50 mg total) by mouth every 6 (six) hours as needed. Patient not taking: Reported on 09/06/2014 05/27/14   Aviva Signs Md, MD    Allergies as of 09/06/2014 - Review Complete 09/06/2014  Allergen Reaction Noted  . Codeine  04/03/2014  . Erythromycin Nausea And Vomiting 04/03/2014    Family History  Problem Relation Age of Onset  . Colon cancer Neg Hx   . Diverticulitis Mother   . Ulcers Mother     History   Social History  . Marital Status: Married    Spouse Name: N/A  . Number of Children: N/A  . Years of Education: N/A   Occupational History  . Retired Therapist, sports    Social History Main Topics  . Smoking status: Former Smoker -- 0.50 packs/day for 25 years    Types: E-cigarettes    Quit date: 02/20/2005  . Smokeless tobacco: Never Used     Comment: E-Cigarette  . Alcohol Use: No  . Drug Use: No  . Sexual Activity: Yes    Birth Control/ Protection: Surgical   Other Topics Concern  . Not on file   Social History Narrative    Review of Systems: See HPI, otherwise negative ROS  Physical Exam: BP 135/67 mmHg  Pulse 84  Temp(Src) 98 F (36.7 C) (Oral)  Ht 5\' 3"  (1.6 m)  Wt 152 lb 6.4 oz (69.128 kg)  BMI 27.00 kg/m2 General:   Alert,  Well-developed, well-nourished, pleasant and cooperative in NAD Skin:  Intact without significant lesions or rashes. Eyes:  Sclera clear, no icterus.   Conjunctiva pink. Ears:  Normal auditory acuity. Nose:  No deformity, discharge,  or lesions. Mouth:  No deformity or lesions. Neck:  Supple; no masses or thyromegaly. No significant cervical adenopathy. Lungs:  Clear throughout to auscultation.   No wheezes, crackles, or rhonchi. No acute distress. Heart:  Regular rate and rhythm; no murmurs, clicks, rubs,  or gallops. Abdomen: Non-distended, normal  bowel sounds.  Soft and nontender without appreciable mass or hepatosplenomegaly.  Pulses:  Normal pulses noted. Extremities:  Without clubbing or edema.  Impression:  Pleasant 74 year old lady with lymphocytic colitis symptoms much improved after a course of budesonide. She still occasionally has some bouts of diarrhea. She may be left with irritable bowel syndrome. Recent antibiotics and cholecystectomy may have also contributed some altered bowel function. Overall, she is much better than previously initially seen last November. GERD symptoms well controlled with OTC PPI. History colonic adenoma removed last year.   Recommendations:  Continue Imodium as needed for occasional diarrhea  Office visit here in 6 months and prn.  Call if diarrhea worsens  Continue Nexium for GERD  One  more colonoscopy for surveillance purposes in 4 years.     Notice: This dictation was prepared with Dragon dictation along with smaller phrase technology. Any transcriptional errors that result from this process are unintentional and may not be corrected upon review.

## 2014-09-06 NOTE — Patient Instructions (Signed)
Continue Imodium as needed for occasional diarrhea  Office visit here in 6 months and prn.  Call if diarrhea worsens  Continue Nexium for GERD

## 2014-11-25 ENCOUNTER — Other Ambulatory Visit: Payer: Self-pay

## 2015-02-18 ENCOUNTER — Encounter: Payer: Self-pay | Admitting: Internal Medicine

## 2015-05-02 IMAGING — RF DG ERCP WO/W SPHINCTEROTOMY
1 series · 15 of 18 positions shown · non-contrast
Comparison: CT 05/04/2014

CLINICAL DATA: Intraoperative ERCP with sphincterotomy and
pancreatic stent placement. Choledocholithiasis.

EXAM:
ERCP
TECHNIQUE: Multiple spot images obtained with the fluoroscopic device and
submitted for interpretation post-procedure.

[Series 1: run · 7 acquisitions, 15 frames shown]
[im 1/7]
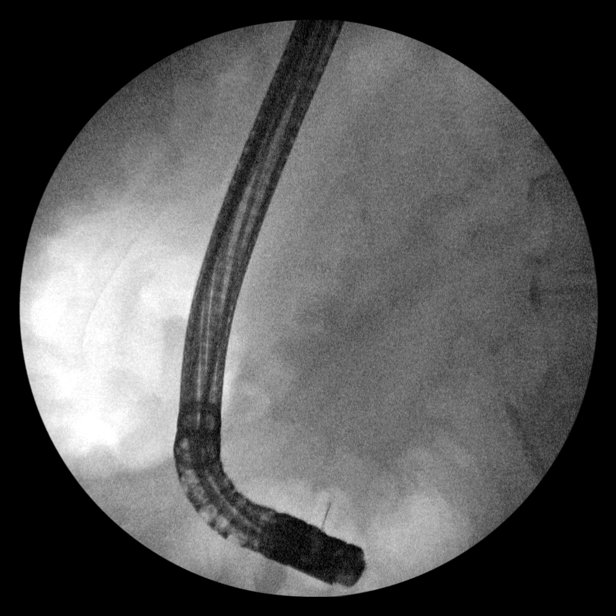
[im 2/7]
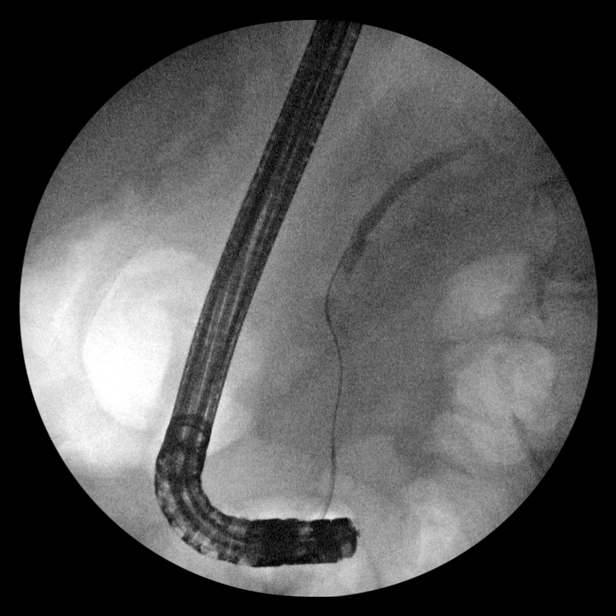
[im 3/7]
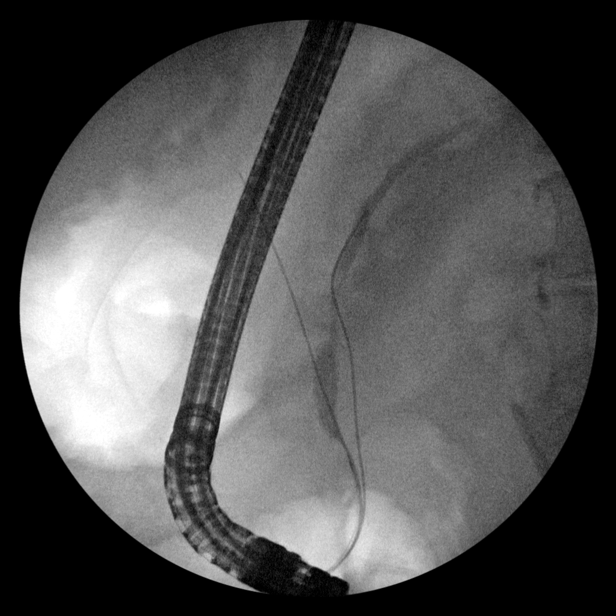
[im 3/7]
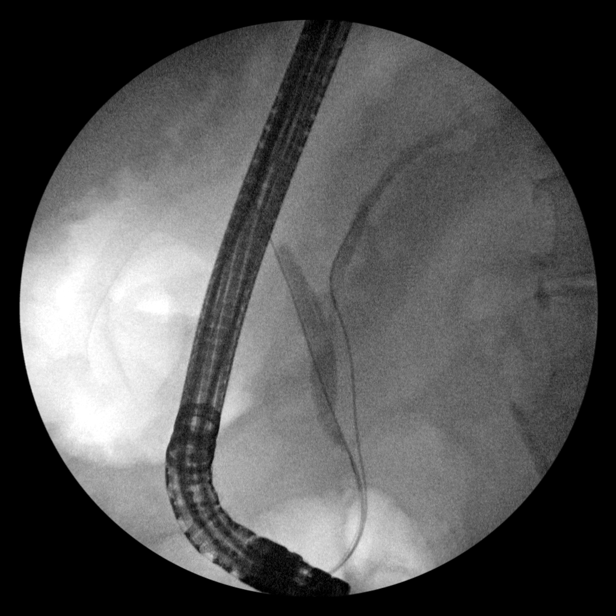
[im 3/7]
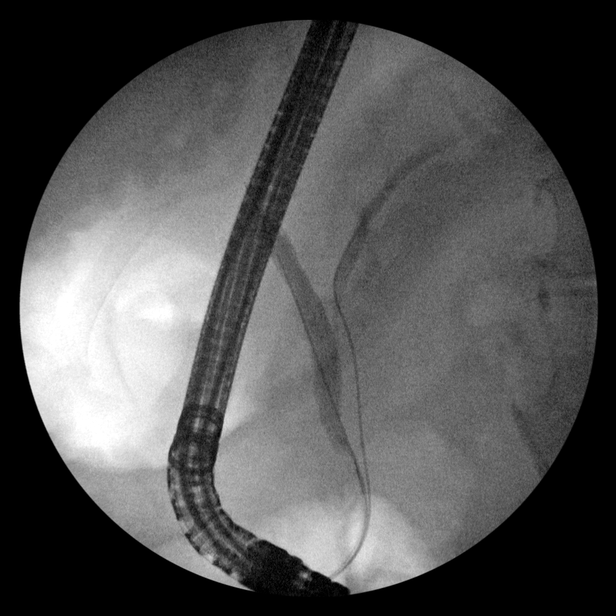
[im 4/7]
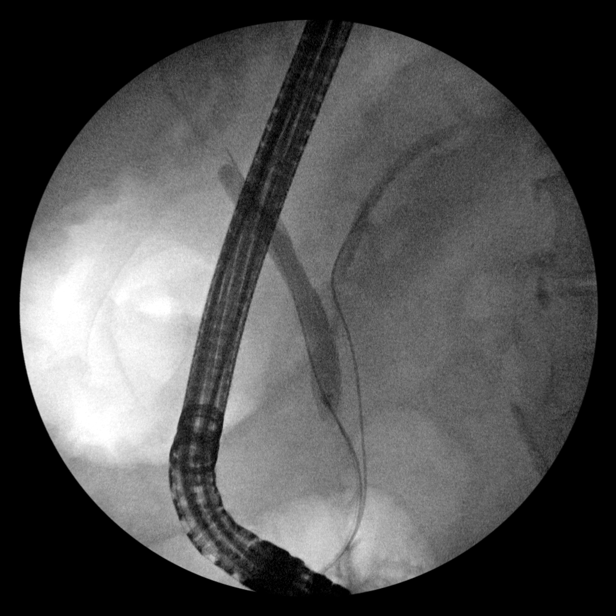
[im 4/7]
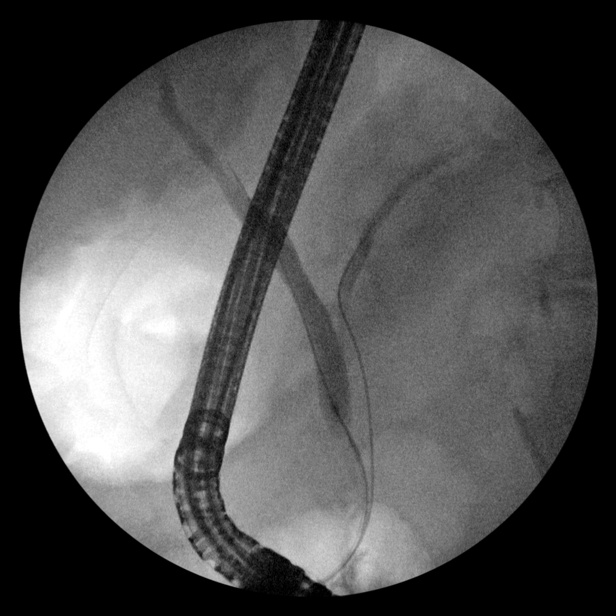
[im 4/7]
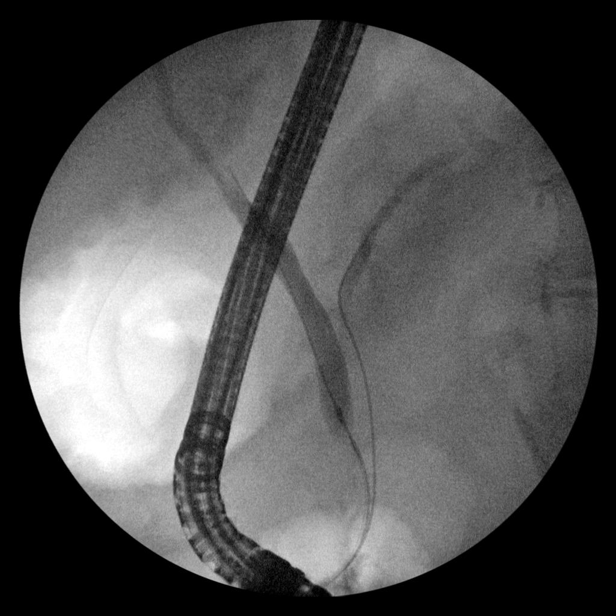
[im 5/7]
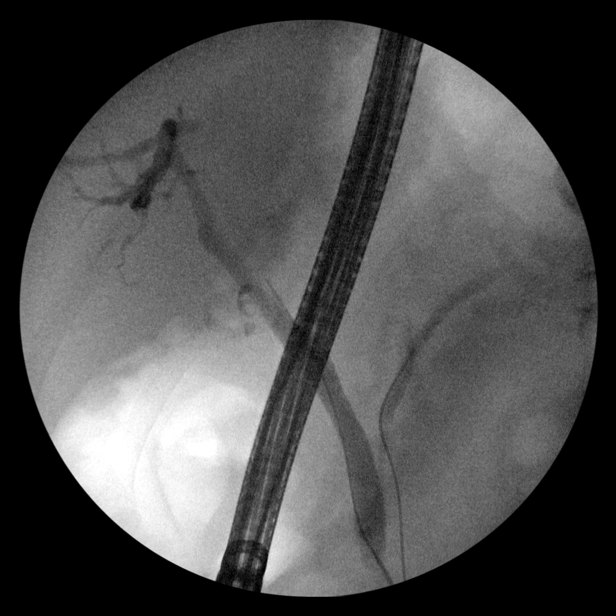
[im 5/7]
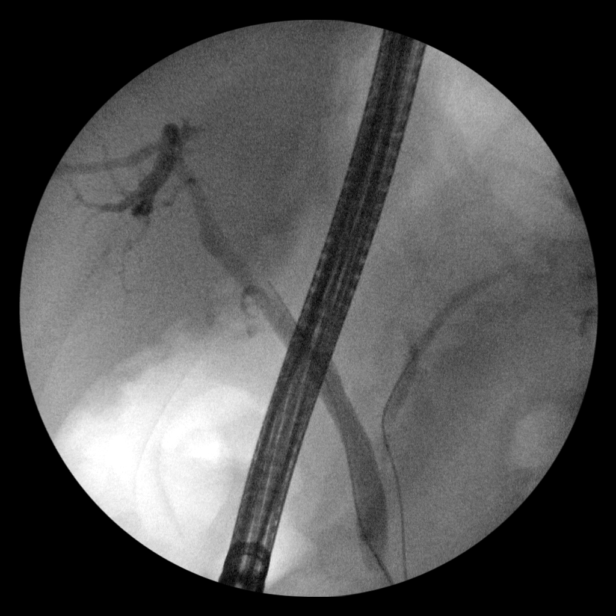
[im 5/7]
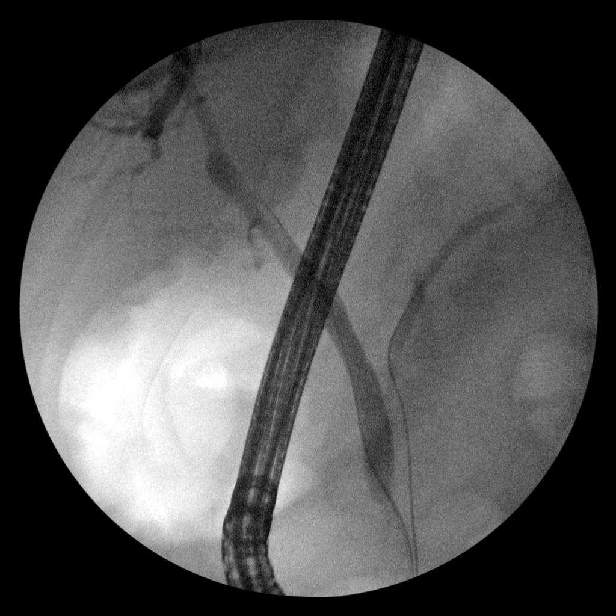
[im 5/7]
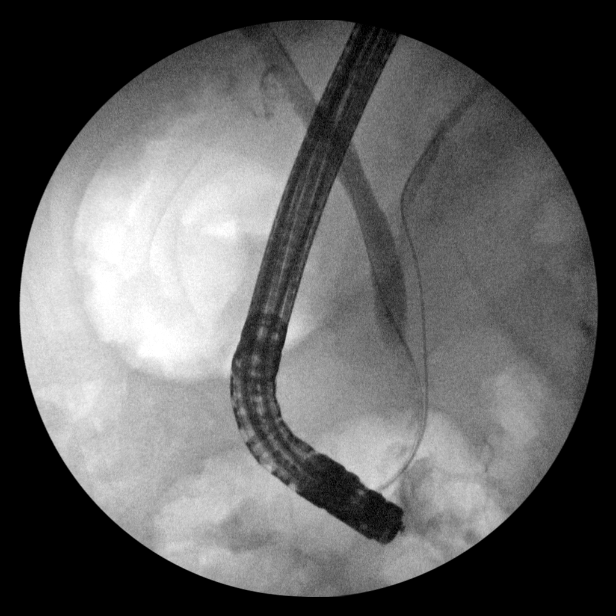
[im 6/7]
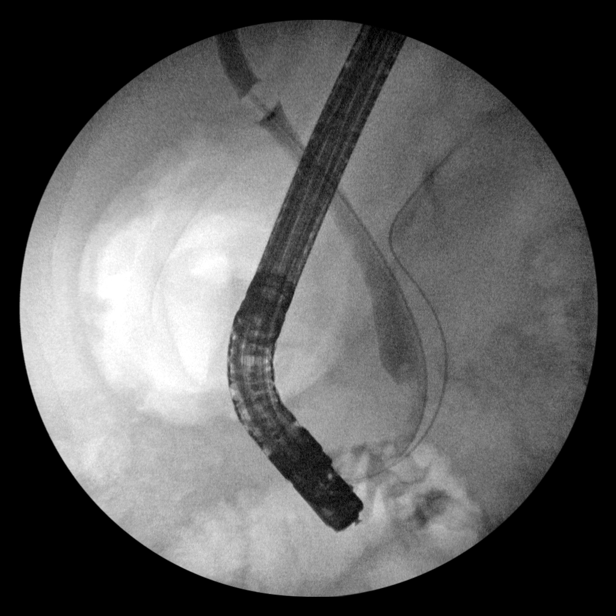
[im 6/7]
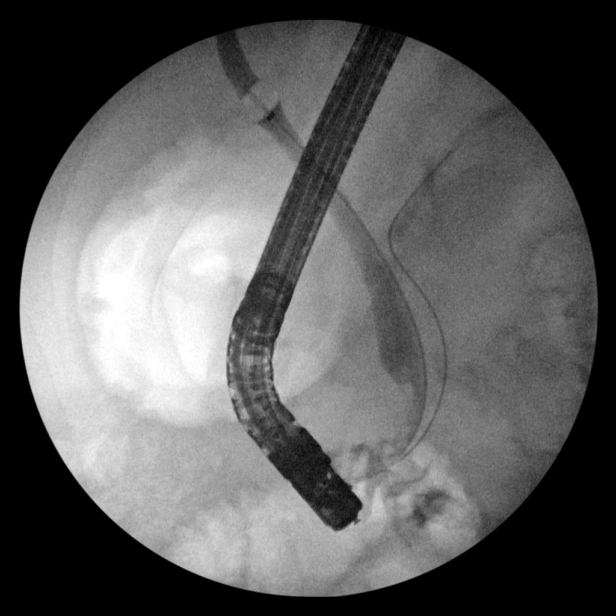
[im 7/7]
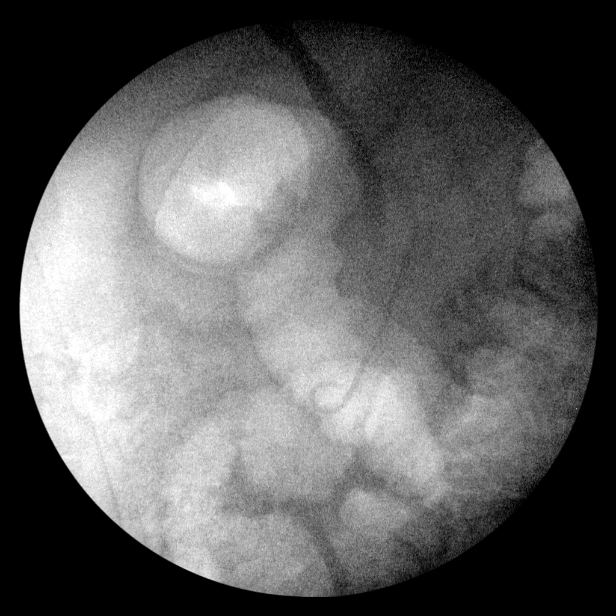

[15 of 18 positions shown; findings below may reference images not displayed]

FINDINGS: Seven intraprocedural fluoroscopic images are provided,
demonstrating cannulation of the normal caliber common duct and
pancreatic duct. No intrahepatic ductal dilatation. Balloon sweep is
performed, with multiple mobile filling defects at the distal common
duct near the ampulla which could represent gas bubbles or stones.
Pancreatic stent placed.
IMPRESSION: ERCP imaging as above.

These images were submitted for radiologic interpretation only.
Please see the procedural report for the amount of contrast and the
fluoroscopy time utilized.

## 2015-06-05 IMAGING — CR DG CHEST 2V
2 series · 2 of 2 positions shown · non-contrast
Comparison: Scout image for abdominal CT scan dated 05/04/2014

CLINICAL DATA: Nonproductive cough and shortness of breath.

EXAM:
CHEST  2 VIEW

[view not recorded (1 of 2)]
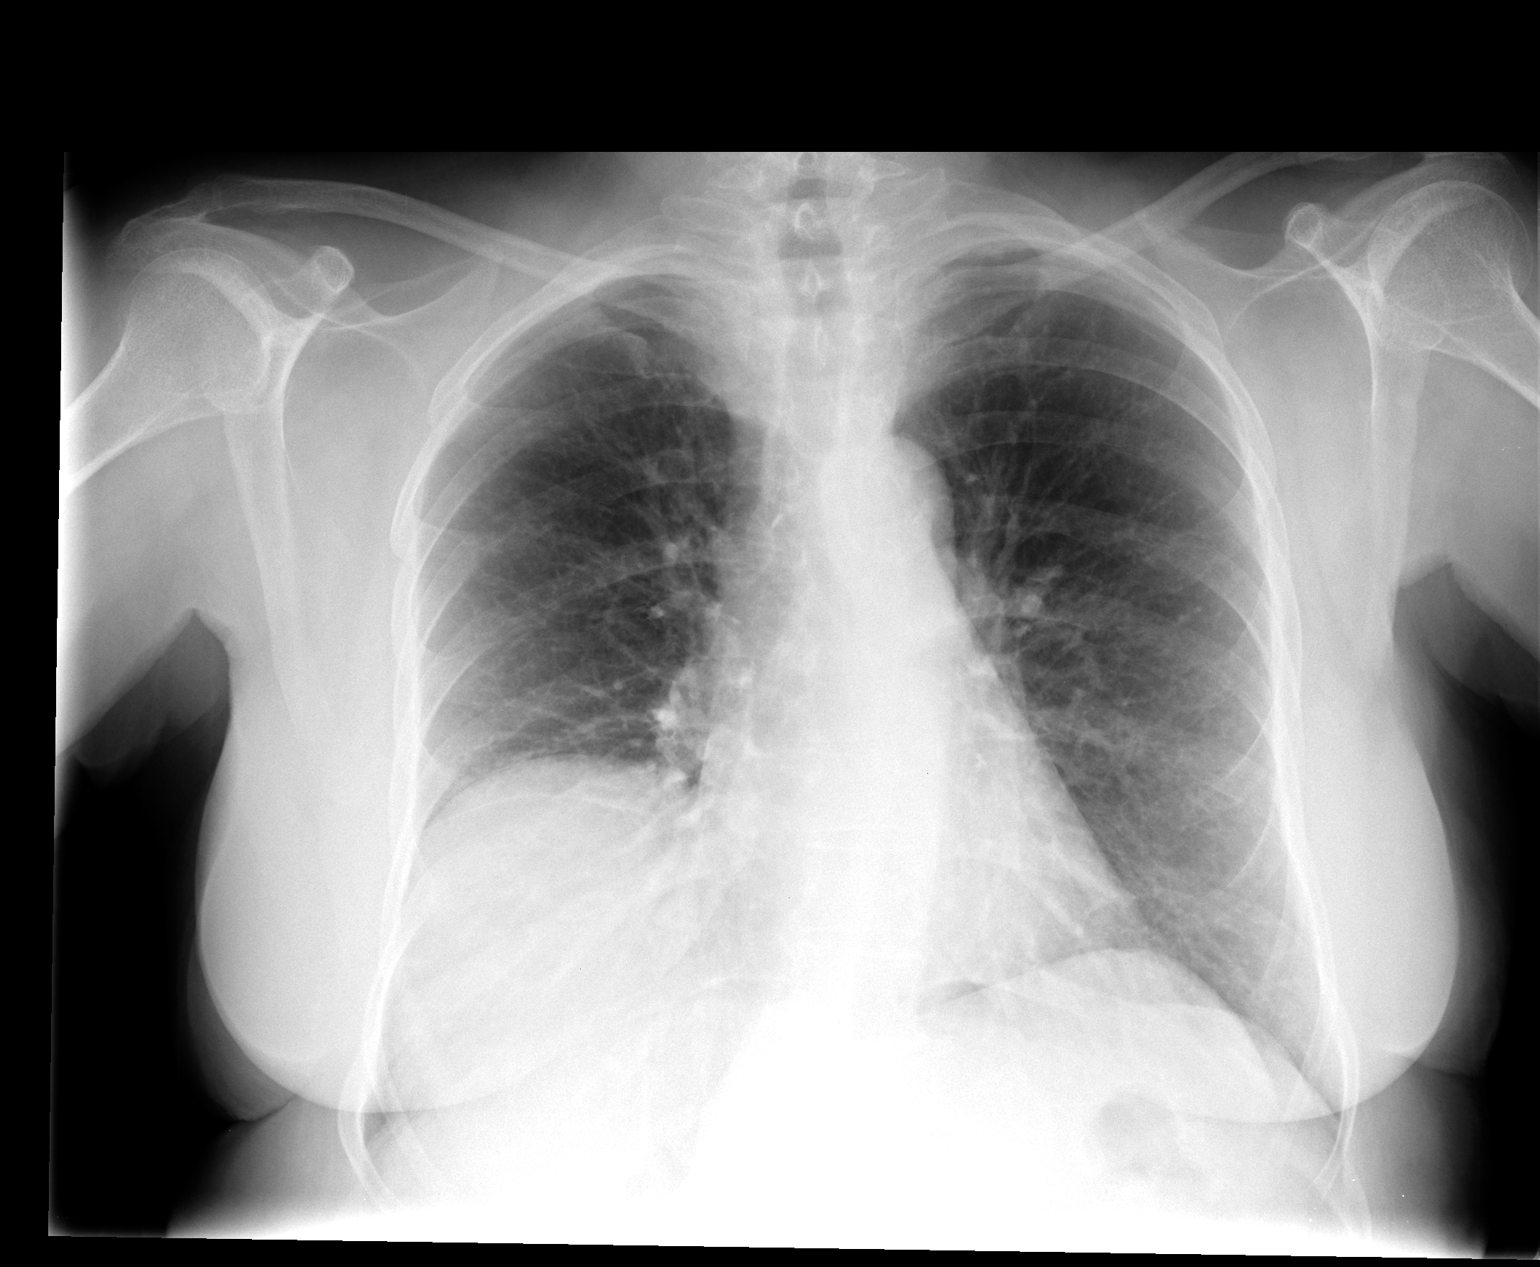

[view not recorded (2 of 2)]
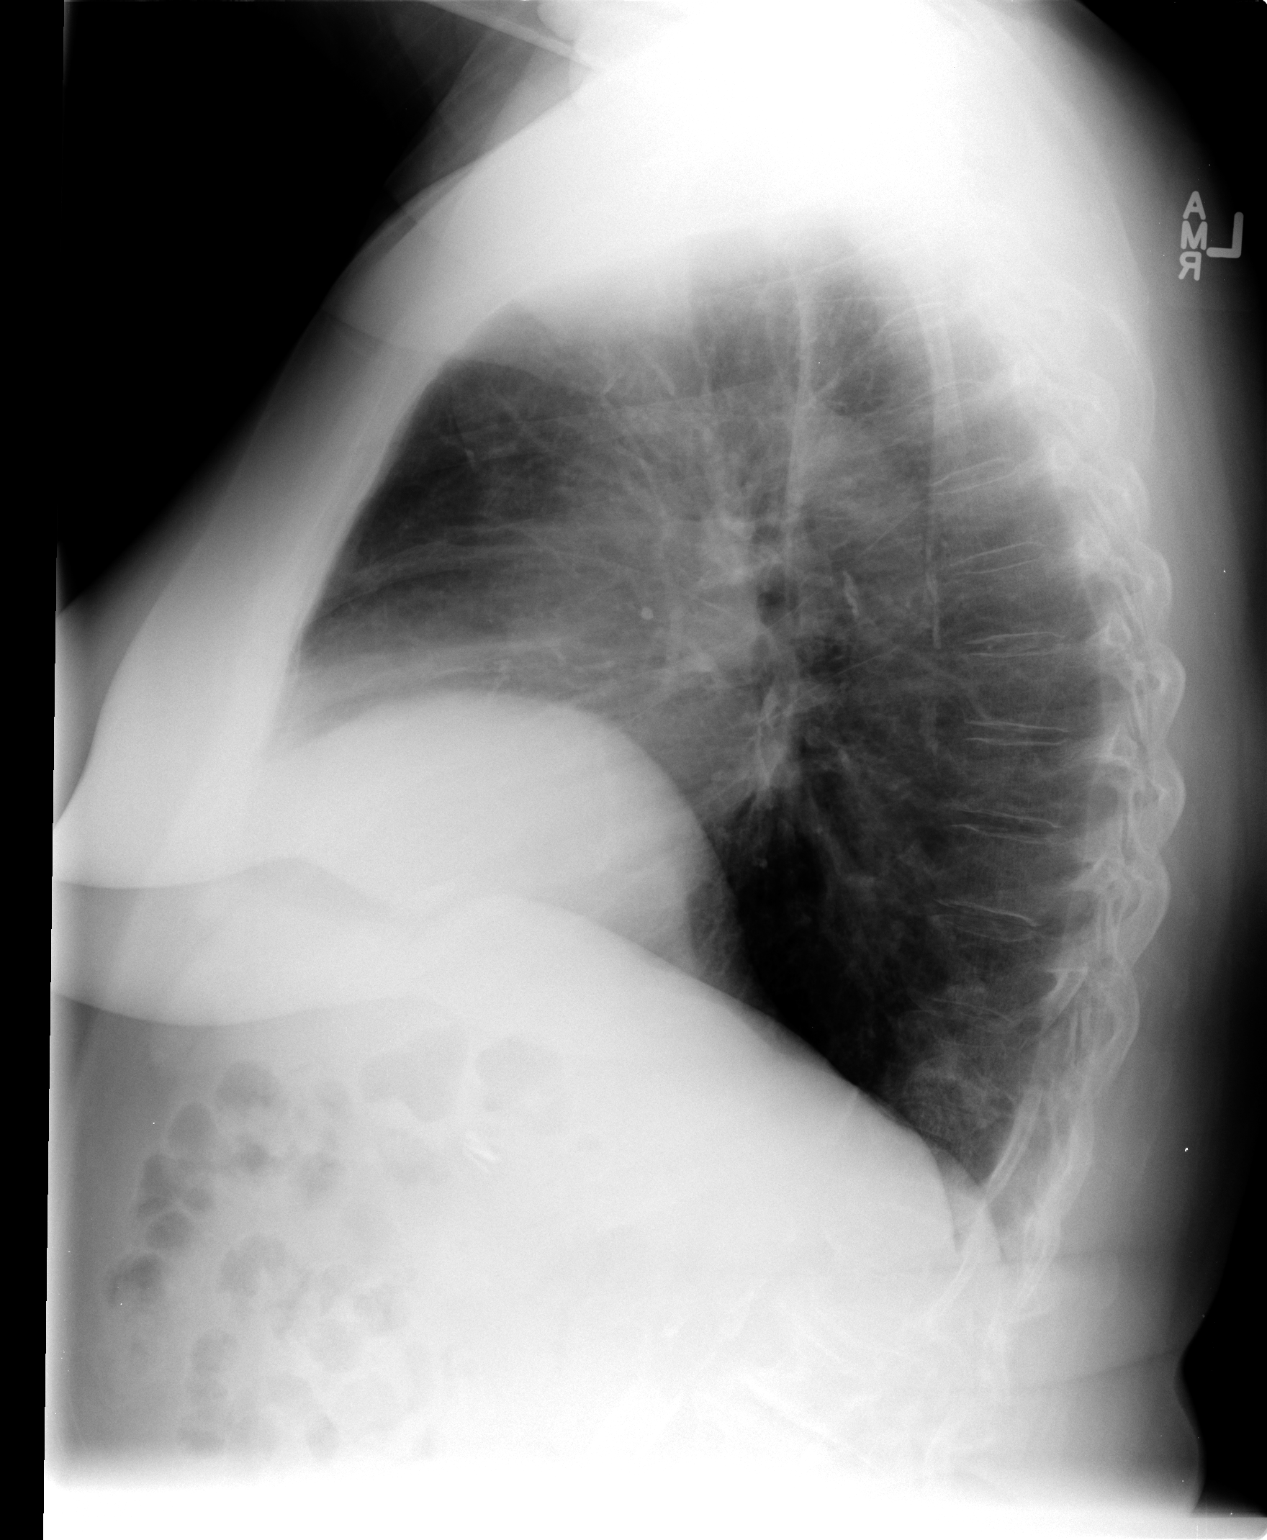

[2 of 2 positions shown; findings below may reference images not displayed]

FINDINGS: There is elevation of the anterior aspect of the right
hemidiaphragm, unchanged since the prior CT scan. Heart size and
pulmonary vascularity are normal. No infiltrates or effusions.
Slight calcification in the thoracic aorta. Thoracolumbar scoliosis.
IMPRESSION: No active cardiopulmonary disease.

## 2016-07-08 ENCOUNTER — Telehealth: Payer: Self-pay | Admitting: Internal Medicine

## 2016-07-08 NOTE — Telephone Encounter (Signed)
PLEASE CALL PATIENT CONCERNING DIARRHEA, SHE HAS APPT NEXT Friday WITH [facility] BUT WANTS TO KNOW IF SOMETHING CAN BE  CALLED IN SO SHE CAN TAKE IT BEFORE COMING TO APPOINTMENT

## 2016-07-08 NOTE — Telephone Encounter (Signed)
Spoke with the pt, she has been having problems with diarrhea again. She is taking imodium but its not helping. She has an appt next Friday with [facility]. Pt wants to know if she can go ahead and start on some steroids prior to her appt? She is aware that it will be tomorrow before I can get back with her.

## 2016-07-09 NOTE — Telephone Encounter (Signed)
I called in the Rx to [facility]

## 2016-07-09 NOTE — Telephone Encounter (Signed)
Asked about recent antibiotic exposure. If none, please call in a prescription for Uceris 9 mg daily Disp 30.  Keep office visit appointment next week.

## 2016-07-09 NOTE — Telephone Encounter (Signed)
I tried to call patient with no answer. I also tried to call her husbands cell phone with no answer. I did leave a message

## 2016-07-12 NOTE — Telephone Encounter (Addendum)
Pt's called and said that she is better. She also said that [facility] did not have the medication. I called [facility] this morning and talked Caryl Pina and she said it was put on hold because of her insurance(the  Cost was to high. She said that she will just wait and see [facility] Friday.

## 2016-07-12 NOTE — Telephone Encounter (Signed)
noted 

## 2016-07-16 ENCOUNTER — Ambulatory Visit (INDEPENDENT_AMBULATORY_CARE_PROVIDER_SITE_OTHER): Payer: Medicare Other | Admitting: Internal Medicine

## 2016-07-16 ENCOUNTER — Encounter: Payer: Self-pay | Admitting: Internal Medicine

## 2016-07-16 VITALS — BP 141/67 | HR 76 | Temp 97.9°F | Ht 63.5 in | Wt 146.8 lb

## 2016-07-16 DIAGNOSIS — K52832 Lymphocytic colitis: Secondary | ICD-10-CM | POA: Diagnosis not present

## 2016-07-16 DIAGNOSIS — D126 Benign neoplasm of colon, unspecified: Secondary | ICD-10-CM

## 2016-07-16 NOTE — Patient Instructions (Addendum)
Continue Imodium as needed for diarrhea  Office visit in 1 year and as needed

## 2016-07-16 NOTE — Progress Notes (Signed)
Primary Care Physician:  Clinton Quant, MD Primary Gastroenterologist:  Dr. Gala Romney  Pre-Procedure History & Physical: HPI:  Diane Hawkins is a 76 y.o. female here for flare of diarrhea recently. History of lymphocytic colitis. Called in last week developed -  multiple episodes of nonbloody diarrhea. Was concerned. Given her history, we are embarked on an empiric course of oral budesonide. Prescription was sent - found to be very expensive. Patient decided to go with Imodium;  this checked her diarrhea nicely and she now  takes 1 or 2 doses daily. Feels she is back to baseline. She has had no  Fever, chills, nausea or vomiting. Diarrhea,  in retrospect, may have been due to some other factor (i.e. mild food poisoning or self-limiting viral illness). At any rate, she is doing well now.  History of simple colonic adenoma removed previously; no future colonoscopy recommended.  Past Medical History:  Diagnosis Date  . Back pain   . Diabetes ([place])   . GERD (gastroesophageal reflux disease)   . Glaucoma   . Hypercholesterolemia   . Hypertension   . Lymphocytic colitis 2015  . Scoliosis   . Tubular adenoma 2015    Past Surgical History:  Procedure Laterality Date  . ABDOMINAL HYSTERECTOMY     and bladder tac  . BACK SURGERY     spinal stenosis  . BALLOON DILATION N/A 05/04/2014   Procedure: BALLOON DILATION;  Surgeon: Rogene Houston, MD;  Location: [facility];  Service: Endoscopy;  Laterality: N/A;  . CATARACT EXTRACTION, BILATERAL    . CHOLECYSTECTOMY N/A 05/27/2014   Procedure: LAPAROSCOPIC CHOLECYSTECTOMY;  Surgeon: Jamesetta So, MD;  Location: [facility];  Service: General;  Laterality: N/A;  . COLONOSCOPY  Oct 2012   Dr. Algis Greenhouse: few small rectal polyps s/p biopsy, adenomatous polyps  . COLONOSCOPY N/A 04/09/2014   Dr.Lilyahna Sirmon- colonic diverticulosis,polyp removed, subtly abnormal colonic mucosa bx=lymphocytic colitis and tubular adenoma  . ERCP N/A 05/04/2014   Dr. Laural Golden-  cystic duct was patent, biliary sphincterotomy performed and single pigmented stome removed with stone balloon extractor  . GALLBLADDER SURGERY    . GANGLION CYST EXCISION Right   . REMOVAL OF STONES N/A 05/04/2014   Procedure: REMOVAL OF STONES;  Surgeon: Rogene Houston, MD;  Location: [facility];  Service: Endoscopy;  Laterality: N/A;  . SPHINCTEROTOMY N/A 05/04/2014   Procedure: SPHINCTEROTOMY;  Surgeon: Rogene Houston, MD;  Location: [facility];  Service: Endoscopy;  Laterality: N/A;  . TONSILLECTOMY     age 58    Prior to Admission medications   Medication Sig Start Date End Date Taking? Authorizing Provider  acetaminophen (TYLENOL) 500 MG tablet Take 1,000 mg by mouth every 6 (six) hours as needed for mild pain.   Yes Historical Provider, MD  amLODipine (NORVASC) 5 MG tablet Take 5 mg by mouth daily.   Yes Historical Provider, MD  aspirin 325 MG tablet Take 325 mg by mouth as needed.   Yes Historical Provider, MD  carvedilol (COREG) 12.5 MG tablet Take 12.5 mg by mouth 2 (two) times daily.   Yes Historical Provider, MD  Cholecalciferol (VITAMIN D PO) Take 1,000 Units by mouth.   Yes Historical Provider, MD  Esomeprazole Magnesium (NEXIUM PO) Take 23 mg by mouth daily.    Yes Historical Provider, MD  Insulin Aspart Prot & Aspart (NOVOLOG MIX 70/30 [place]) Inject into the skin. 30 units in the AM 20 units in the PM   Yes Historical Provider, MD  Loperamide HCl (IMODIUM PO) Take 1 tablet by mouth daily as needed (diarrhea).    Yes Historical Provider, MD  rosuvastatin (CRESTOR) 10 MG tablet Take 10 mg by mouth daily.   Yes Historical Provider, MD  traMADol (ULTRAM) 50 MG tablet Take 1 tablet (50 mg total) by mouth every 6 (six) hours as needed. Patient taking differently: Take 50 mg by mouth every 12 (twelve) hours. Takes 1-2 q 12 hours 05/27/14  Yes Aviva Signs, MD  aspirin 500 MG buffered tablet Take 500 mg by mouth daily.    Historical Provider, MD  aspirin 500 MG tablet Take 500 mg by mouth  every 6 (six) hours as needed for pain.    Historical Provider, MD  Budesonide (UCERIS) 9 MG TB24 Take 1 capsule by mouth daily. Patient not taking: Reported on 09/06/2014 04/19/14   [name], NP  LORazepam (ATIVAN) 0.5 MG tablet Take 0.5 mg by mouth daily as needed for anxiety.     Historical Provider, MD  Probiotic Product (PROBIOTIC DAILY PO) Take 1 tablet by mouth daily.    Historical Provider, MD  Probiotic Product (RESTORA PO) Take 1 capsule by mouth daily.    Historical Provider, MD    Allergies as of 07/16/2016 - Review Complete 07/16/2016  Allergen Reaction Noted  . Codeine  04/03/2014  . Erythromycin Nausea And Vomiting 04/03/2014    Family History  Problem Relation Age of Onset  . Colon cancer Neg Hx   . Diverticulitis Mother   . Ulcers Mother     Social History   Social History  . Marital status: Married    Spouse name: N/A  . Number of children: N/A  . Years of education: N/A   Occupational History  . Retired Therapist, sports    Social History Main Topics  . Smoking status: Former Smoker    Packs/day: 0.50    Years: 25.00    Types: E-cigarettes    Quit date: 02/20/2005  . Smokeless tobacco: Never Used     Comment: E-Cigarette  . Alcohol use No  . Drug use: No  . Sexual activity: Yes    Birth control/ protection: Surgical   Other Topics Concern  . Not on file   Social History Narrative  . No narrative on file    Review of Systems: See HPI, otherwise negative ROS  Physical Exam: BP (!) 141/67   Pulse 76   Temp 97.9 F (36.6 C) (Oral)   Ht 5' 3.5" (1.613 m)   Wt 146 lb 12.8 oz (66.6 kg)   BMI 25.60 kg/m  General:   Alert,   pleasant and cooperative in NAD Lungs:  Clear throughout to auscultation.   No wheezes, crackles, or rhonchi. No acute distress. Heart:  Regular rate and rhythm; no murmurs, clicks, rubs,  or gallops. Abdomen: Non-distended, normal bowel sounds.  Soft and nontender without appreciable mass or hepatosplenomegaly.  Pulses:  Normal  pulses noted. Extremities:  Without clubbing or edema.  Impression:  Pleasant 76 year old lady with history of lymphocytic colitis. Has done historically very well. Recent bout of diarrhea-appears to be self-limited at this time. Well-controlled with Imodium. No need for further evaluation or treatment at this time so long as diarrhea remains checked.  I do not recommend another colonoscopy unless new symptoms develop.       Notice: This dictation was prepared with Dragon dictation along with smaller phrase technology. Any transcriptional errors that result from this process are unintentional and may not be corrected upon  review.

## 2017-01-18 ENCOUNTER — Encounter (HOSPITAL_COMMUNITY): Payer: Self-pay | Admitting: Cardiology

## 2017-01-18 ENCOUNTER — Emergency Department (HOSPITAL_COMMUNITY): Payer: Medicare Other

## 2017-01-18 ENCOUNTER — Emergency Department (HOSPITAL_COMMUNITY)
Admission: EM | Admit: 2017-01-18 | Discharge: 2017-01-18 | Disposition: A | Discharge status: Home / Self Care | Payer: Medicare Other | Attending: Emergency Medicine | Admitting: Emergency Medicine

## 2017-01-18 DIAGNOSIS — Z87891 Personal history of nicotine dependence: Secondary | ICD-10-CM | POA: Diagnosis not present

## 2017-01-18 DIAGNOSIS — D696 Thrombocytopenia, unspecified: Secondary | ICD-10-CM | POA: Insufficient documentation

## 2017-01-18 DIAGNOSIS — E119 Type 2 diabetes mellitus without complications: Secondary | ICD-10-CM | POA: Insufficient documentation

## 2017-01-18 DIAGNOSIS — M545 Low back pain, unspecified: Secondary | ICD-10-CM

## 2017-01-18 DIAGNOSIS — I10 Essential (primary) hypertension: Secondary | ICD-10-CM | POA: Insufficient documentation

## 2017-01-18 DIAGNOSIS — Z79899 Other long term (current) drug therapy: Secondary | ICD-10-CM | POA: Diagnosis not present

## 2017-01-18 DIAGNOSIS — Z7982 Long term (current) use of aspirin: Secondary | ICD-10-CM | POA: Diagnosis not present

## 2017-01-18 LAB — COMPREHENSIVE METABOLIC PANEL
ALT: 17 U/L (ref 14–54)
ANION GAP: 11 (ref 5–15)
AST: 23 U/L (ref 15–41)
Albumin: 3.7 g/dL (ref 3.5–5.0)
Alkaline Phosphatase: 90 U/L (ref 38–126)
BILIRUBIN TOTAL: 1.1 mg/dL (ref 0.3–1.2)
BUN: 18 mg/dL (ref 6–20)
CALCIUM: 9.3 mg/dL (ref 8.9–10.3)
CO2: 25 mmol/L (ref 22–32)
CREATININE: 1 mg/dL (ref 0.44–1.00)
Chloride: 99 mmol/L — ABNORMAL LOW (ref 101–111)
GFR calc non Af Amer: 54 mL/min — ABNORMAL LOW (ref >60)
Glucose, Bld: 176 mg/dL — ABNORMAL HIGH (ref 65–99)
Potassium: 3.4 mmol/L — ABNORMAL LOW (ref 3.5–5.1)
SODIUM: 135 mmol/L (ref 135–145)
TOTAL PROTEIN: 7.8 g/dL (ref 6.5–8.1)

## 2017-01-18 LAB — CBC WITH DIFFERENTIAL/PLATELET
BASOS PCT: 0 %
Basophils Absolute: 0 10*3/uL (ref 0.0–0.1)
EOS ABS: 0 10*3/uL (ref 0.0–0.7)
Eosinophils Relative: 0 %
HCT: 33 % — ABNORMAL LOW (ref 36.0–46.0)
Hemoglobin: 10.9 g/dL — ABNORMAL LOW (ref 12.0–15.0)
LYMPHS PCT: 54 %
Lymphs Abs: 5.8 10*3/uL — ABNORMAL HIGH (ref 0.7–4.0)
MCH: 30.6 pg (ref 26.0–34.0)
MCHC: 33 g/dL (ref 30.0–36.0)
MCV: 92.7 fL (ref 78.0–100.0)
MONO ABS: 3.9 10*3/uL — AB (ref 0.1–1.0)
Monocytes Relative: 36 %
Neutro Abs: 1.1 10*3/uL — ABNORMAL LOW (ref 1.7–7.7)
Neutrophils Relative %: 10 %
Platelets: 19 10*3/uL — CL (ref 150–400)
RBC: 3.56 MIL/uL — AB (ref 3.87–5.11)
RDW: 14.6 % (ref 11.5–15.5)
WBC: 10.8 10*3/uL — AB (ref 4.0–10.5)

## 2017-01-18 LAB — URINALYSIS, ROUTINE W REFLEX MICROSCOPIC
BILIRUBIN URINE: NEGATIVE
Glucose, UA: NEGATIVE mg/dL
Hgb urine dipstick: NEGATIVE
KETONES UR: NEGATIVE mg/dL
LEUKOCYTES UA: NEGATIVE
Nitrite: NEGATIVE
Protein, ur: NEGATIVE mg/dL
Specific Gravity, Urine: 1.012 (ref 1.005–1.030)
pH: 5 (ref 5.0–8.0)

## 2017-01-18 LAB — PLATELET COUNT: Platelets: 18 10*3/uL — CL (ref 150–400)

## 2017-01-18 MED ORDER — PREDNISONE 50 MG PO TABS
60.0000 mg | ORAL_TABLET | ORAL | Status: AC
  Administered 2017-01-18: 20:00:00 60 mg via ORAL
  Filled 2017-01-18: qty 1

## 2017-01-18 MED ORDER — PREDNISONE 20 MG PO TABS: 60.0000 mg | ORAL_TABLET | Freq: Every day | ORAL | 0 refills | Status: AC

## 2017-01-18 MED ORDER — HYDROMORPHONE HCL 1 MG/ML IJ SOLN
0.5000 mg | Freq: Once | INTRAMUSCULAR | Status: AC
  Administered 2017-01-18: 0.5 mg via INTRAVENOUS
  Filled 2017-01-18: qty 1

## 2017-01-18 MED ORDER — FENTANYL CITRATE (PF) 100 MCG/2ML IJ SOLN
12.5000 ug | Freq: Once | INTRAMUSCULAR | Status: AC
  Administered 2017-01-18: 12.5 ug via INTRAVENOUS
  Filled 2017-01-18: qty 2

## 2017-01-18 MED ORDER — HYDROCODONE-ACETAMINOPHEN 5-325 MG PO TABS: 1.0000 | ORAL_TABLET | Freq: Four times a day (QID) | ORAL | 0 refills | Status: DC | PRN

## 2017-01-18 MED ORDER — SODIUM CHLORIDE 0.9 % IV BOLUS (SEPSIS)
1000.0000 mL | Freq: Once | INTRAVENOUS | Status: AC
  Administered 2017-01-18: 1000 mL via INTRAVENOUS

## 2017-01-18 MED ORDER — SODIUM CHLORIDE 0.9 % IV SOLN
INTRAVENOUS | Status: DC
  Administered 2017-01-18: 18:00:00 via INTRAVENOUS

## 2017-01-18 NOTE — ED Notes (Signed)
Pt wheeled to car. Pt verbalized understanding of discharge instructions.  

## 2017-01-18 NOTE — ED Notes (Signed)
Date and time results received: 01/18/17 7:39 PM   Test:Platelets Critical Value: 18  Name of Provider Notified: Dr. Vanita Panda  Orders Received? Or Actions Taken?: Notified.

## 2017-01-18 NOTE — Discharge Instructions (Signed)
As discussed, your back pain and your low platelet count both require further evaluation.  Please take all medication as directed and be sure to follow-up with both your primary care physician and/or pain management specialist and our hematologist.  Return here for concerning changes in your condition.

## 2017-01-18 NOTE — ED Provider Notes (Signed)
[facility] DEPT Provider Note   CSN: [id number] Arrival date & time: 01/18/17  1419     History   Chief Complaint No chief complaint on file.   HPI Diane Hawkins is a 76 y.o. female.  HPI  Vision presents with back pain. Pain is in the lower back, more prominently in the left, though it is bilaterally. Pain is in the lumbar region, nonradiating. There is no associated abdominal pain, no urinary retention, nor incontinence Patient has baseline paresthesia in both lower extremities, this is not changed since the pain began about 2 weeks ago. She notes history of spinal stenosis, one prior surgical intervention, in the distant past. She also history of kidney stones, and notes that she currently is having dark, though not painful urination. No fever, no chills, though she does have clammy sensation upon awakening. She is here with multiple family members who assist with the history of present illness.   Past Medical History:  Diagnosis Date  . Back pain   . Diabetes ([place])   . GERD (gastroesophageal reflux disease)   . Glaucoma   . Hypercholesterolemia   . Hypertension   . Lymphocytic colitis 2015  . Scoliosis   . Tubular adenoma 2015    Patient Active Problem List   Diagnosis Date Noted  . Choledocholithiasis 05/04/2014  . Abdominal pain, acute, epigastric 05/04/2014  . 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 Diabetes mellitus ([place])   . Hypertension   . GERD (gastroesophageal reflux disease)   . Hypercholesterolemia   . Essential hypertension   . Transaminitis   . History of colonic polyps   . Diarrhea 04/05/2014    Past Surgical History:  Procedure Laterality Date  . ABDOMINAL HYSTERECTOMY     and bladder tac  . BACK SURGERY     spinal stenosis  . BALLOON DILATION N/A 05/04/2014   Procedure: BALLOON DILATION;  Surgeon: Rogene Houston, MD;  Location: [facility];  Service: Endoscopy;  Laterality: N/A;  . CATARACT EXTRACTION, BILATERAL    . CHOLECYSTECTOMY N/A  05/27/2014   Procedure: LAPAROSCOPIC CHOLECYSTECTOMY;  Surgeon: Jamesetta So, MD;  Location: [facility];  Service: General;  Laterality: N/A;  . COLONOSCOPY  Oct 2012   Dr. Algis Greenhouse: few small rectal polyps s/p biopsy, adenomatous polyps  . COLONOSCOPY N/A 04/09/2014   Dr.Rourk- colonic diverticulosis,polyp removed, subtly abnormal colonic mucosa bx=lymphocytic colitis and tubular adenoma  . ERCP N/A 05/04/2014   Dr. Laural Golden- cystic duct was patent, biliary sphincterotomy performed and single pigmented stome removed with stone balloon extractor  . GALLBLADDER SURGERY    . GANGLION CYST EXCISION Right   . REMOVAL OF STONES N/A 05/04/2014   Procedure: REMOVAL OF STONES;  Surgeon: Rogene Houston, MD;  Location: [facility];  Service: Endoscopy;  Laterality: N/A;  . SPHINCTEROTOMY N/A 05/04/2014   Procedure: SPHINCTEROTOMY;  Surgeon: Rogene Houston, MD;  Location: [facility];  Service: Endoscopy;  Laterality: N/A;  . TONSILLECTOMY     age 39    OB History    Gravida Para Term Preterm AB Living   1 1 1     1    SAB TAB Ectopic Multiple Live Births                   Home Medications    Prior to Admission medications   Medication Sig Start Date End Date Taking? Authorizing Provider  acetaminophen (TYLENOL) 500 MG tablet Take 1,000 mg by mouth every 6 (six) hours as needed for mild  pain.   Yes [provider]  amLODipine (NORVASC) 5 MG tablet Take 5 mg by mouth daily.   Yes [provider]  aspirin 325 MG tablet Take 325 mg by mouth daily.    Yes [provider]  carvedilol (COREG) 12.5 MG tablet Take 12.5 mg by mouth 2 (two) times daily.   Yes [provider]  Cholecalciferol (VITAMIN D) 2000 units CAPS Take 1 capsule by mouth daily.   Yes [provider]  esomeprazole (NEXIUM) 20 MG capsule Take 20 mg by mouth daily at 12 noon.   Yes [provider]  insulin aspart protamine - aspart (NOVOLOG MIX 70/30 FLEXPEN) (70-30) 100 UNIT/ML FlexPen Inject  20-25 Units into the skin 2 (two) times daily. 25 units in the morning and 20 units in the evening   Yes [provider]  Multiple Vitamin (MULTIVITAMIN WITH MINERALS) TABS tablet Take 1 tablet by mouth daily.   Yes [provider]  Probiotic Product (PROBIOTIC DAILY PO) Take 1 tablet by mouth daily.   Yes [provider]  rosuvastatin (CRESTOR) 10 MG tablet Take 10 mg by mouth every evening.    Yes [provider]  traMADol (ULTRAM) 50 MG tablet Take 1 tablet (50 mg total) by mouth every 6 (six) hours as needed. Patient taking differently: Take 50 mg by mouth every 12 (twelve) hours as needed for moderate pain or severe pain.  05/27/14  Yes Aviva Signs, MD    Family History Family History  Problem Relation Age of Onset  . Diverticulitis Mother   . Ulcers Mother   . Colon cancer Neg Hx     Social History Social History  Substance Use Topics  . Smoking status: Former Smoker    Packs/day: 0.50    Years: 25.00    Types: E-cigarettes    Quit date: 02/20/2005  . Smokeless tobacco: Never Used     Comment: E-Cigarette  . Alcohol use No     Allergies   Codeine and Erythromycin   Review of Systems Review of Systems  Constitutional:       Per HPI, otherwise negative  HENT:       Per HPI, otherwise negative  Respiratory:       Per HPI, otherwise negative  Cardiovascular:       Per HPI, otherwise negative  Gastrointestinal: Negative for vomiting.  Endocrine:       Negative aside from HPI  Genitourinary:       Neg aside from HPI   Musculoskeletal:       Per HPI, otherwise negative  Skin: Negative.   Neurological: Negative for syncope.     Physical Exam Updated Vital Signs BP (!) 124/56   Pulse 95   Temp (!) 97.4 F (36.3 C) (Oral)   Resp 16   Ht 5\' 3"  (1.6 m)   Wt 67.1 kg (148 lb)   SpO2 98%   BMI 26.22 kg/m   Physical Exam  Constitutional: She is oriented to person, place, and time. She appears well-developed and  well-nourished. No distress.  HENT:  Head: Normocephalic and atraumatic.  Eyes: Conjunctivae and EOM are normal.  Cardiovascular: Normal rate and regular rhythm.   Pulmonary/Chest: Effort normal and breath sounds normal. No stridor. No respiratory distress.  Abdominal: She exhibits no distension.  Musculoskeletal: She exhibits no edema.  Referring pain with both passive and active hip flexion, more pain in the left lower back. Dennis palpation left lower back, no deformity, no crepitus,  no step-off.   Neurological: She is alert and oriented to person, place, and time. No cranial nerve deficit.  Skin: Skin is warm and dry.  Psychiatric: She has a normal mood and affect.  Nursing note and vitals reviewed.    ED Treatments / Results  Labs (all labs ordered are listed, but only abnormal results are displayed) Labs Reviewed  COMPREHENSIVE METABOLIC PANEL - Abnormal; Notable for the following:       Result Value   Potassium 3.4 (*)    Chloride 99 (*)    Glucose, Bld 176 (*)    GFR calc non Af Amer 54 (*)    All other components within normal limits  CBC WITH DIFFERENTIAL/PLATELET - Abnormal; Notable for the following:    WBC 10.8 (*)    RBC 3.56 (*)    Hemoglobin 10.9 (*)    HCT 33.0 (*)    Platelets 19 (*)    Neutro Abs 1.1 (*)    Lymphs Abs 5.8 (*)    Monocytes Absolute 3.9 (*)    All other components within normal limits  PLATELET COUNT - Abnormal; Notable for the following:    Platelets 18 (*)    All other components within normal limits  URINALYSIS, ROUTINE W REFLEX MICROSCOPIC   Repeat laboratory studies consistent with initial results concerning for thrombocytopenia.  Radiology Ct Renal Stone Study  Result Date: 01/18/2017 CLINICAL DATA:  Low back pain for 2 weeks. Left pain worse than right. EXAM: CT ABDOMEN AND PELVIS WITHOUT CONTRAST TECHNIQUE: Multidetector CT imaging of the abdomen and pelvis was performed following the standard protocol without IV contrast.  COMPARISON:  None. FINDINGS: Lower chest: No acute abnormality. Hepatobiliary: The liver dome was not completely included on today's study. The patient is status post cholecystectomy. Pancreas: Unremarkable. No pancreatic ductal dilatation or surrounding inflammatory changes. Spleen: Normal in size without focal abnormality. Adrenals/Urinary Tract: The adrenal glands are normal. There is a cyst in the lower pole of the left kidney. No renal stones, suspicious masses, hydronephrosis, or perinephric stranding. No ureterectasis or ureteral stones. The bladder is normal. Stomach/Bowel: The stomach and small bowel are normal. Colonic diverticulosis is seen without diverticulitis. The colon is otherwise normal in appearance. The appendix is unremarkable. Vascular/Lymphatic: Atherosclerotic changes seen in the non aneurysmal aorta, iliac vessels, and femoral vessels. No adenopathy. Reproductive: Status post hysterectomy. No adnexal masses. Other: No free air or free fluid. Musculoskeletal: Scoliotic curvature is seen in the lumbar spine. Severe degenerative changes are noted. IMPRESSION: 1. Severe scoliotic and degenerative changes in the lumbar spine. 2. No renal stones or obstruction. 3. Atherosclerotic changes in the aorta and branching vessels. Aortic Atherosclerosis (ICD10-I70.0). Electronically Signed   By: Dorise Bullion III M.D   On: 01/18/2017 19:51    Procedures Procedures (including critical care time)  Medications Ordered in ED Medications  0.9 %  sodium chloride infusion ( Intravenous New Bag/Given 01/18/17 1733)  fentaNYL (SUBLIMAZE) injection 12.5 mcg (12.5 mcg Intravenous Given 01/18/17 1733)  sodium chloride 0.9 % bolus 1,000 mL (0 mLs Intravenous Stopped 01/18/17 1858)  predniSONE (DELTASONE) tablet 60 mg (60 mg Oral Given 01/18/17 2022)  HYDROmorphone (DILAUDID) injection 0.5 mg (0.5 mg Intravenous Given 01/18/17 2022)     Initial Impression / Assessment and Plan / ED Course  I have reviewed  the triage vital signs and the nursing notes.  Pertinent labs & imaging results that were available during my care of the patient were reviewed by me and considered in my  medical decision making (see chart for details).  Initial labs notable for mild leukocytosis, and thrombocytopenia. Patient has no history of this, repeat blood tests will be performed. Urinalysis pending.   9:10 PM On repeat exam the patient appears in similar condition, no distress, no neurologic deficiency, no evidence for bleeding. I reviewed all findings with the patient and her family members. 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 With no evidence for other acute new pathology, including no evidence for obstructing stone, new neurologic dysfunction, the patient is appropriate for discharge with close outpatient follow-up. Patient received both analgesia and initiation of steroids here.  Final Clinical Impressions(s) / ED Diagnoses   Final diagnoses:  Acute bilateral low back pain without sciatica  Thrombocytopenia (HCC)    New Prescriptions New Prescriptions   HYDROCODONE-ACETAMINOPHEN (NORCO/VICODIN) 5-325 MG TABLET    Take 1 tablet by mouth every 6 (six) hours as needed for severe pain.   PREDNISONE (DELTASONE) 20 MG TABLET    Take 3 tablets (60 mg total) by mouth daily with breakfast. For the next seven days     Carmin Muskrat, MD 01/18/17 2111

## 2017-01-18 NOTE — ED Notes (Signed)
CRITICAL VALUE ALERT  Critical Value:  Platelet 19  Date & Time Notied:  01/18/2017 1814  Provider Notified: Vanita Panda  Orders Received/Actions taken: No order received at this time

## 2017-01-18 NOTE — ED Triage Notes (Addendum)
Lower back pain times 2 weeks.   L>R side pain.   Denies any injury.  States she has had a difficult time getting up out of bed.

## 2017-01-21 ENCOUNTER — Encounter (HOSPITAL_COMMUNITY): Payer: Medicare Other | Attending: Oncology | Admitting: Oncology

## 2017-01-21 ENCOUNTER — Encounter (HOSPITAL_COMMUNITY): Payer: Medicare Other

## 2017-01-21 ENCOUNTER — Other Ambulatory Visit (HOSPITAL_COMMUNITY): Payer: Self-pay

## 2017-01-21 ENCOUNTER — Encounter (HOSPITAL_COMMUNITY): Payer: Self-pay

## 2017-01-21 DIAGNOSIS — Z90710 Acquired absence of both cervix and uterus: Secondary | ICD-10-CM | POA: Insufficient documentation

## 2017-01-21 DIAGNOSIS — D696 Thrombocytopenia, unspecified: Secondary | ICD-10-CM | POA: Diagnosis present

## 2017-01-21 DIAGNOSIS — K219 Gastro-esophageal reflux disease without esophagitis: Secondary | ICD-10-CM | POA: Insufficient documentation

## 2017-01-21 DIAGNOSIS — D693 Immune thrombocytopenic purpura: Secondary | ICD-10-CM

## 2017-01-21 DIAGNOSIS — E119 Type 2 diabetes mellitus without complications: Secondary | ICD-10-CM

## 2017-01-21 DIAGNOSIS — I10 Essential (primary) hypertension: Secondary | ICD-10-CM | POA: Insufficient documentation

## 2017-01-21 DIAGNOSIS — Z87891 Personal history of nicotine dependence: Secondary | ICD-10-CM | POA: Diagnosis not present

## 2017-01-21 DIAGNOSIS — Z9889 Other specified postprocedural states: Secondary | ICD-10-CM | POA: Diagnosis not present

## 2017-01-21 DIAGNOSIS — M545 Low back pain: Secondary | ICD-10-CM | POA: Diagnosis not present

## 2017-01-21 LAB — COMPREHENSIVE METABOLIC PANEL
ALK PHOS: 81 U/L (ref 38–126)
ALT: 46 U/L (ref 14–54)
ANION GAP: 10 (ref 5–15)
AST: 37 U/L (ref 15–41)
Albumin: 3.6 g/dL (ref 3.5–5.0)
BILIRUBIN TOTAL: 0.5 mg/dL (ref 0.3–1.2)
BUN: 23 mg/dL — ABNORMAL HIGH (ref 6–20)
CALCIUM: 9.8 mg/dL (ref 8.9–10.3)
CO2: 27 mmol/L (ref 22–32)
Chloride: 105 mmol/L (ref 101–111)
Creatinine, Ser: 0.81 mg/dL (ref 0.44–1.00)
Glucose, Bld: 112 mg/dL — ABNORMAL HIGH (ref 65–99)
Potassium: 3.5 mmol/L (ref 3.5–5.1)
Sodium: 142 mmol/L (ref 135–145)
TOTAL PROTEIN: 7.1 g/dL (ref 6.5–8.1)

## 2017-01-21 LAB — TSH: TSH: 1.759 u[IU]/mL (ref 0.350–4.500)

## 2017-01-21 LAB — FOLATE: Folate: 31 ng/mL (ref >5.9)

## 2017-01-21 LAB — VITAMIN B12: Vitamin B-12: 567 pg/mL (ref 180–914)

## 2017-01-21 MED ORDER — PREDNISONE 10 MG PO TABS: ORAL_TABLET | ORAL | 0 refills | Status: DC

## 2017-01-21 NOTE — Patient Instructions (Addendum)
[place] at [facility]  Discharge Instructions:  You saw Dr. Talbert Cage today.   Labs today and on next visit  _______________________________________________________________  Thank you for choosing [place] at [facility] to provide your oncology and hematology care.  To afford each patient quality time with our providers, please arrive at least 15 minutes before your scheduled appointment.  You need to re-schedule your appointment if you arrive 10 or more minutes late.  We strive to give you quality time with our providers, and arriving late affects you and other patients whose appointments are after yours.  Also, if you no show three or more times for appointments you may be dismissed from the clinic.  Again, thank you for choosing [facility] at [place] hope is that these requests will allow you access to exceptional care and in a timely manner. _______________________________________________________________  If you have questions after your visit, please contact our office at [phone] between the hours of 8:30 a.m. and 5:00 p.m. Voicemails left after 4:30 p.m. will not be returned until the following business day. _______________________________________________________________  For prescription refill requests, have your pharmacy contact our office. _______________________________________________________________  Recommendations made by the consultant and any test results will be sent to your referring physician. _______________________________________________________________

## 2017-01-21 NOTE — Progress Notes (Signed)
CRITICAL VALUE ALERT Critical value received:  Platelets-17 Date of notification:  01/21/17 Time of notification: 1509 Critical value read back:  Yes.   Nurse who received alert:  M.Yecheskel Kurek, LPN MD notified (1st page):  L.Talbert Cage, MD

## 2017-01-21 NOTE — Progress Notes (Signed)
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 female is here because of thrombocytopenia. Patient went to the ED on 01/18/17 for two-week history of severe low back pain. She had a routine CBC performed at that time which demonstrated at her platelet count was 18,000. Patient is a very good historian, she states that when she had her last CBC performed by her PCP in July 2018 it was 257K. When she was in the ED a UA and chest x-ray were performed and were both normal. CT renal stone protocol demonstrated severe scoliotic and degenerative changes in the lumbar spine but no renal stones or obstruction. I spoke with Dr. 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 Patient was started on prednisone 60 mg by mouth daily on 01/18/17 and she's been taking it since then. She is noted that she's been having petechiae but otherwise denies any major bruising or bleeding. She denies any melena, hematochezia, gum bleeding, hemoptysis, hematuria. She has been having insomnia due to the prednisone. She states that her taste buds have been off for years and has decreased appetite. She denies any chest pain or shortness of breath or abdominal pain. Patient has a history of microscopic colitis and she states that she feels like she is having a flare recently since she's been having more diarrhea. Otherwise she denies any other infections. She's been having diarrhea up to 4 episodes a day for which she takes imodium. Her last colonoscopy by Dr. Gala Romney was in 2015, results were reviewed. Patient has stopped tramadol and aspirin on her own, which she thinks may be contributing to the thrombocytopenia. She has been  taking vicoden 5-'325mg'$  1 tab q4 h for her back pain instead of the tramadol however she states it is not controlling her pain.  Review of Systems - Oncology ROS as per HPI otherwise 12 point ROS was negative.  MEDICAL HISTORY: Past Medical History:  Diagnosis Date  . Back pain   . Diabetes ([place])   . GERD (gastroesophageal reflux disease)   . Glaucoma   . Hypercholesterolemia   . Hypertension   . Lymphocytic colitis 2015  . Scoliosis   . Tubular adenoma 2015    SURGICAL HISTORY: Past Surgical History:  Procedure Laterality Date  . ABDOMINAL HYSTERECTOMY     and bladder tac  . BACK SURGERY     spinal stenosis  . BALLOON DILATION N/A 05/04/2014   Procedure: BALLOON DILATION;  Surgeon: Rogene Houston, MD;  Location: [facility];  Service: Endoscopy;  Laterality: N/A;  . CATARACT EXTRACTION, BILATERAL    . CHOLECYSTECTOMY N/A 05/27/2014   Procedure: LAPAROSCOPIC CHOLECYSTECTOMY;  Surgeon: Jamesetta So, MD;  Location: [facility];  Service: General;  Laterality: N/A;  . COLONOSCOPY  Oct 2012   Dr. Algis Greenhouse: few small rectal polyps s/p biopsy, adenomatous polyps  . COLONOSCOPY N/A 04/09/2014   Dr.Rourk- colonic diverticulosis,polyp removed, subtly abnormal colonic mucosa bx=lymphocytic colitis and tubular adenoma  . ERCP N/A 05/04/2014   Dr. Laural Golden- cystic duct was patent, biliary sphincterotomy performed and single pigmented stome removed with stone balloon extractor  . GALLBLADDER SURGERY    . GANGLION CYST EXCISION Right   . REMOVAL OF STONES N/A 05/04/2014  Procedure: REMOVAL OF STONES;  Surgeon: Malissa Hippo, MD;  Location: [facility];  Service: Endoscopy;  Laterality: N/A;  . SPHINCTEROTOMY N/A 05/04/2014   Procedure: SPHINCTEROTOMY;  Surgeon: Malissa Hippo, MD;  Location: [facility];  Service: Endoscopy;  Laterality: N/A;  . TONSILLECTOMY     age 53    SOCIAL HISTORY: Social History   Social History  . Marital status: Married    Spouse name: N/A  . Number of children: N/A  .  Years of education: N/A   Occupational History  . Retired Charity fundraiser    Social History Main Topics  . Smoking status: Former Smoker    Packs/day: 0.50    Years: 25.00    Types: E-cigarettes    Quit date: 02/20/2005  . Smokeless tobacco: Never Used     Comment: E-Cigarette  . Alcohol use No  . Drug use: No  . Sexual activity: Yes    Birth control/ protection: Surgical   Other Topics Concern  . Not on file   Social History Narrative  . No narrative on file    FAMILY HISTORY Family History  Problem Relation Age of Onset  . Diverticulitis Mother   . Ulcers Mother   . Colon cancer Neg Hx     ALLERGIES:  is allergic to byetta 10 mcg pen  [exenatide]; codeine; erythromycin; metformin; and pioglitazone.  MEDICATIONS:  Current Outpatient Prescriptions  Medication Sig Dispense Refill  . acetaminophen (TYLENOL) 500 MG tablet Take 1,000 mg by mouth every 6 (six) hours as needed for mild pain.    Marland Kitchen amLODipine (NORVASC) 5 MG tablet Take 5 mg by mouth daily.    . carvedilol (COREG) 12.5 MG tablet Take 12.5 mg by mouth 2 (two) times daily.    . Cholecalciferol (VITAMIN D) 2000 units CAPS Take 1 capsule by mouth daily.    Marland Kitchen esomeprazole (NEXIUM) 20 MG capsule Take 20 mg by mouth daily at 12 noon.    Marland Kitchen HYDROcodone-acetaminophen (NORCO/VICODIN) 5-325 MG tablet Take 1 tablet by mouth every 6 (six) hours as needed for severe pain. 15 tablet 0  . insulin aspart protamine - aspart (NOVOLOG MIX 70/30 FLEXPEN) (70-30) 100 UNIT/ML FlexPen Inject 20-25 Units into the skin 2 (two) times daily. 25 units in the morning and 20 units in the evening    . Multiple Vitamin (MULTIVITAMIN WITH MINERALS) TABS tablet Take 1 tablet by mouth daily.    . predniSONE (DELTASONE) 20 MG tablet Take 3 tablets (60 mg total) by mouth daily with breakfast. For the next seven days 21 tablet 0  . Probiotic Product (PROBIOTIC DAILY PO) Take 1 tablet by mouth daily.    . rosuvastatin (CRESTOR) 10 MG tablet Take 10 mg by mouth  every evening.      No current facility-administered medications for this visit.     PHYSICAL EXAMINATION:   Vitals:   01/21/17 1351  BP: (!) 150/58  Pulse: 77  Resp: 16  Temp: 97.6 F (36.4 C)  SpO2: 100%    Filed Weights   01/21/17 1351  Weight: 147 lb 12.8 oz (67 kg)     Physical Exam Constitutional: Well-developed, well-nourished, and in no distress.   HENT:  Head: Normocephalic and atraumatic.  Mouth/Throat: No oropharyngeal exudate. Mucosa moist. Eyes: Pupils are equal, round, and reactive to light. Conjunctivae are normal. No scleral icterus.  Neck: Normal range of motion. Neck supple. No JVD present.  Cardiovascular: Normal rate, regular rhythm and normal heart sounds.  Exam reveals no  gallop and no friction rub.   No murmur heard. Pulmonary/Chest: Effort normal and breath sounds normal. No respiratory distress. No wheezes.No rales.  Abdominal: Soft. Bowel sounds are normal. No distension. There is no tenderness. There is no guarding.  Musculoskeletal: No edema or tenderness.  Lymphadenopathy:    No cervical or supraclavicular adenopathy.  Neurological: Alert and oriented to person, place, and time. No cranial nerve deficit.  Skin: Skin is warm and dry. No rash noted. No erythema. No pallor.  Psychiatric: Affect and judgment normal.   LABORATORY DATA: I have personally reviewed the data as listed:  Admission on 01/18/2017, Discharged on 01/18/2017  Component Date Value Ref Range Status  . Sodium 01/18/2017 135  135 - 145 mmol/L Final  . Potassium 01/18/2017 3.4* 3.5 - 5.1 mmol/L Final  . Chloride 01/18/2017 99* 101 - 111 mmol/L Final  . CO2 01/18/2017 25  22 - 32 mmol/L Final  . Glucose, Bld 01/18/2017 176* 65 - 99 mg/dL Final  . BUN 01/18/2017 18  6 - 20 mg/dL Final  . Creatinine, Ser 01/18/2017 1.00  0.44 - 1.00 mg/dL Final  . Calcium 01/18/2017 9.3  8.9 - 10.3 mg/dL Final  . Total Protein 01/18/2017 7.8  6.5 - 8.1 g/dL Final  . Albumin 01/18/2017 3.7   3.5 - 5.0 g/dL Final  . AST 01/18/2017 23  15 - 41 U/L Final  . ALT 01/18/2017 17  14 - 54 U/L Final  . Alkaline Phosphatase 01/18/2017 90  38 - 126 U/L Final  . Total Bilirubin 01/18/2017 1.1  0.3 - 1.2 mg/dL Final  . GFR calc non Af Amer 01/18/2017 54* >60 mL/min Final  . GFR calc Af Amer 01/18/2017 >60  >60 mL/min Final   Comment: (NOTE) The eGFR has been calculated using the CKD EPI equation. This calculation has not been validated in all clinical situations. eGFR's persistently <60 mL/min signify possible Chronic Kidney Disease.   . Anion gap 01/18/2017 11  5 - 15 Final  . WBC 01/18/2017 10.8* 4.0 - 10.5 K/uL Final   Comment: WHITE COUNT CONFIRMED ON SMEAR ATYPICAL LYMPHOCYTES   . RBC 01/18/2017 3.56* 3.87 - 5.11 MIL/uL Final  . Hemoglobin 01/18/2017 10.9* 12.0 - 15.0 g/dL Final  . HCT 01/18/2017 33.0* 36.0 - 46.0 % Final  . MCV 01/18/2017 92.7  78.0 - 100.0 fL Final  . MCH 01/18/2017 30.6  26.0 - 34.0 pg Final  . MCHC 01/18/2017 33.0  30.0 - 36.0 g/dL Final  . RDW 01/18/2017 14.6  11.5 - 15.5 % Final  . Platelets 01/18/2017 19* 150 - 400 K/uL Final   Comment: REPEATED TO VERIFY CRITICAL RESULT CALLED TO, READ BACK BY AND VERIFIED WITH: SWANSON,J @ 1813 ON 8.21.18 BY BOWMANL SPECIMEN CHECKED FOR CLOTS PLATELET COUNT CONFIRMED BY SMEAR   . Neutrophils Relative % 01/18/2017 10  % Final  . Lymphocytes Relative 01/18/2017 54  % Final  . Monocytes Relative 01/18/2017 36  % Final  . Eosinophils Relative 01/18/2017 0  % Final  . Basophils Relative 01/18/2017 0  % Final  . Neutro Abs 01/18/2017 1.1* 1.7 - 7.7 K/uL Final  . Lymphs Abs 01/18/2017 5.8* 0.7 - 4.0 K/uL Final  . Monocytes Absolute 01/18/2017 3.9* 0.1 - 1.0 K/uL Final  . Eosinophils Absolute 01/18/2017 0.0  0.0 - 0.7 K/uL Final  . Basophils Absolute 01/18/2017 0.0  0.0 - 0.1 K/uL Final  . Color, Urine 01/18/2017 YELLOW  YELLOW Final  . APPearance 01/18/2017 CLEAR  CLEAR Final  .  Specific Gravity, Urine 01/18/2017  1.012  1.005 - 1.030 Final  . pH 01/18/2017 5.0  5.0 - 8.0 Final  . Glucose, UA 01/18/2017 NEGATIVE  NEGATIVE mg/dL Final  . Hgb urine dipstick 01/18/2017 NEGATIVE  NEGATIVE Final  . Bilirubin Urine 01/18/2017 NEGATIVE  NEGATIVE Final  . Ketones, ur 01/18/2017 NEGATIVE  NEGATIVE mg/dL Final  . Protein, ur 01/18/2017 NEGATIVE  NEGATIVE mg/dL Final  . Nitrite 01/18/2017 NEGATIVE  NEGATIVE Final  . Leukocytes, UA 01/18/2017 NEGATIVE  NEGATIVE Final  . Platelets 01/18/2017 18* 150 - 400 K/uL Final   Comment: REPEATED TO VERIFY CRITICAL RESULT CALLED TO, READ BACK BY AND VERIFIED WITH: DOSS,M @ 1938 ON 8.21.18 BY BOWMAN,L     RADIOGRAPHIC STUDIES: I have personally reviewed the radiological images as listed and agree with the findings in the report  Ct Renal Stone Study  Result Date: 01/18/2017 CLINICAL DATA:  Low back pain for 2 weeks. Left pain worse than right. EXAM: CT ABDOMEN AND PELVIS WITHOUT CONTRAST TECHNIQUE: Multidetector CT imaging of the abdomen and pelvis was performed following the standard protocol without IV contrast. COMPARISON:  None. FINDINGS: Lower chest: No acute abnormality. Hepatobiliary: The liver dome was not completely included on today's study. The patient is status post cholecystectomy. Pancreas: Unremarkable. No pancreatic ductal dilatation or surrounding inflammatory changes. Spleen: Normal in size without focal abnormality. Adrenals/Urinary Tract: The adrenal glands are normal. There is a cyst in the lower pole of the left kidney. No renal stones, suspicious masses, hydronephrosis, or perinephric stranding. No ureterectasis or ureteral stones. The bladder is normal. Stomach/Bowel: The stomach and small bowel are normal. Colonic diverticulosis is seen without diverticulitis. The colon is otherwise normal in appearance. The appendix is unremarkable. Vascular/Lymphatic: Atherosclerotic changes seen in the non aneurysmal aorta, iliac vessels, and femoral vessels. No  adenopathy. Reproductive: Status post hysterectomy. No adnexal masses. Other: No free air or free fluid. Musculoskeletal: Scoliotic curvature is seen in the lumbar spine. Severe degenerative changes are noted. IMPRESSION: 1. Severe scoliotic and degenerative changes in the lumbar spine. 2. No renal stones or obstruction. 3. Atherosclerotic changes in the aorta and branching vessels. Aortic Atherosclerosis (ICD10-I70.0). 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  PLAN: Continue prednisone at '60mg'$  PO daily for a total of 7 days then I will start to taper it by 10 mg each week for a total of 6 weeks of treatment. She needs to see her PCP regarding monitoring of her DM while on steroids since she will likely have hyperglycemia. I will perform a full thrombocytopenia with labs as stated below.  Repeat CBC today and again in 2 weeks. RTC in 2 weeks for follow up.  Orders Placed This Encounter  Procedures  . US Abdomen Complete    Standing Status:   Future    Standing Expiration Date:   01/21/2018    Order Specific Question:   Reason for Exam (SYMPTOM  OR DIAGNOSIS REQUIRED)    Answer:   thrombocytopenia rule out splenomegaly    Order Specific Question:   Preferred imaging location?    Answer:   [facility]  . CBC with Differential    Standing Status:   Future    Standing Expiration Date:   01/21/2018  . Comprehensive metabolic panel    Standing Status:   Future  Standing Expiration Date:   01/21/2018  . Vitamin B12    Standing Status:   Future    Standing Expiration Date:   01/21/2018  . Folate    Standing Status:   Future    Standing Expiration Date:   01/21/2018  . Hepatitis panel, acute    Standing Status:   Future    Standing Expiration Date:   01/21/2018  . HIV antibody (with reflex)  . Multiple Myeloma Panel  (SPEP&IFE w/QIG)    Standing Status:   Future    Standing Expiration Date:   01/21/2018  . TSH    Standing Status:   Future    Standing Expiration Date:   01/21/2018  . ANA, IFA (with reflex)  . CBC with Differential    Standing Status:   Future    Standing Expiration Date:   01/21/2018  . Copper, serum    Standing Status:   Future    Standing Expiration Date:   01/21/2018  . Rheumatoid factor    Standing Status:   Future    Standing Expiration Date:   01/21/2018    All questions were answered. The patient knows to call the clinic with any problems, questions or concerns.  This note was electronically signed.    Twana First, MD  01/21/2017 2:22 PM

## 2017-01-22 LAB — COPPER, SERUM: Copper: 157 ug/dL (ref 72–166)

## 2017-01-22 LAB — HIV ANTIBODY (ROUTINE TESTING W REFLEX): HIV SCREEN 4TH GENERATION: NONREACTIVE

## 2017-01-22 LAB — HEPATITIS PANEL, ACUTE
HCV Ab: <0.1 s/co ratio (ref 0.0–0.9)
HEP A IGM: NEGATIVE
HEP B C IGM: NEGATIVE
Hepatitis B Surface Ag: NEGATIVE

## 2017-01-22 LAB — RHEUMATOID FACTOR: Rhuematoid fact SerPl-aCnc: <10 IU/mL (ref 0.0–13.9)

## 2017-01-24 LAB — MULTIPLE MYELOMA PANEL, SERUM
ALBUMIN/GLOB SERPL: 1.1 (ref 0.7–1.7)
ALPHA2 GLOB SERPL ELPH-MCNC: 1 g/dL (ref 0.4–1.0)
Albumin SerPl Elph-Mcnc: 3.2 g/dL (ref 2.9–4.4)
Alpha 1: 0.4 g/dL (ref 0.0–0.4)
B-GLOBULIN SERPL ELPH-MCNC: 1 g/dL (ref 0.7–1.3)
Gamma Glob SerPl Elph-Mcnc: 0.7 g/dL (ref 0.4–1.8)
Globulin, Total: 3.2 g/dL (ref 2.2–3.9)
IGG (IMMUNOGLOBIN G), SERUM: 670 mg/dL — AB (ref 700–1600)
IgA: 251 mg/dL (ref 64–422)
IgM (Immunoglobulin M), Srm: 198 mg/dL (ref 26–217)
Total Protein ELP: 6.4 g/dL (ref 6.0–8.5)

## 2017-01-24 LAB — ANTINUCLEAR ANTIBODIES, IFA: ANA Ab, IFA: NEGATIVE

## 2017-01-25 ENCOUNTER — Other Ambulatory Visit (HOSPITAL_COMMUNITY): Payer: Self-pay | Admitting: Adult Health

## 2017-01-25 ENCOUNTER — Encounter (HOSPITAL_COMMUNITY): Payer: Self-pay | Admitting: Adult Health

## 2017-01-25 DIAGNOSIS — D72821 Monocytosis (symptomatic): Secondary | ICD-10-CM

## 2017-01-25 DIAGNOSIS — D696 Thrombocytopenia, unspecified: Secondary | ICD-10-CM

## 2017-01-25 LAB — CBC WITH DIFFERENTIAL/PLATELET
BASOS ABS: 0 10*3/uL (ref 0.0–0.1)
Band Neutrophils: 0 %
Basophils Relative: 0 %
Blasts: 18 %
EOS PCT: 0 %
Eosinophils Absolute: 0 10*3/uL (ref 0.0–0.7)
HEMATOCRIT: 30.3 % — AB (ref 36.0–46.0)
HEMOGLOBIN: 9.9 g/dL — AB (ref 12.0–15.0)
LYMPHS ABS: 8.6 10*3/uL — AB (ref 0.7–4.0)
Lymphocytes Relative: 26 %
MCH: 30.7 pg (ref 26.0–34.0)
MCHC: 32.7 g/dL (ref 30.0–36.0)
MCV: 93.8 fL (ref 78.0–100.0)
METAMYELOCYTES PCT: 0 %
MONOS PCT: 40 %
Monocytes Absolute: 0.2 10*3/uL (ref 0.1–1.0)
Myelocytes: 3 %
NEUTROS ABS: 1.3 10*3/uL — AB (ref 1.7–7.7)
Neutrophils Relative %: 13 %
Other: 0 %
Platelets: 17 10*3/uL — CL (ref 150–400)
Promyelocytes Absolute: 0 %
RBC: 3.23 MIL/uL — AB (ref 3.87–5.11)
RDW: 14.9 % (ref 11.5–15.5)
WBC: 10.1 10*3/uL (ref 4.0–10.5)
nRBC: 4 /100 WBC — ABNORMAL HIGH

## 2017-01-25 LAB — PATHOLOGIST SMEAR REVIEW

## 2017-01-25 NOTE — Progress Notes (Signed)
I called patient and discussed concerns regarding peripheral blood smear results, which are concerning for leukemia per pathologist.   Earliest availability for bone marrow biopsy at [place] is 02/07/17. Clinically, I did not feel comfortable waiting 2 weeks for this patient with circulating peripheral blasts seen on smear and significant monocytosis concerning for possible acute leukemia.  Given that her platelets are < 20,000, she will require platelet transfusion prior to invasive procedure.   Contacted [facility]. They are able to bring the patient in tomorrow 01/26/17 morning at 8 am arrival time for labs (CBC with diff and type & screen).  [facility] staff will transfuse platelets; orders placed for 1 unit.  Then, they will proceed with bone marrow biopsy under image-guidance; orders are in for this as well.    I gave patient the following directions:  -NPO after MN; instructed her to take essential medications with small sips of water if needed.  -Have a driver, as she will receive conscious sedation.  -Bring a current list of her medications for [facility] staff to review.     I discussed the reasoning for bone marrow biopsy and potential risks of procedure, including bleeding.  She agrees to proceed and agrees to go to [facility] for procedure.  I shared with her to be prepared to spend several hours at [facility] tomorrow for the labs, transfusion, and procedure.   She was given the address with instructions on where to go tomorrow morning.    We will reschedule her ultrasound of her abdomen, which was originally scheduled for tomorrow morning here at [facility].  The bone marrow biopsy takes precedence at this time.    She has follow-up scheduled with Dr. Zhou in ~1 month.  If we need to bring her in sooner, based on bone marrow biopsy results, then we will make those arrangements if needed.    Gretchen Dawson, NP [facility] [phone]     

## 2017-01-25 NOTE — Progress Notes (Signed)
Received call from Dr. Gari Crown with pathology.    He reports that upon review of peripheral blood smear for this patient, there is evidence of circulating blasts with significant monocytosis.  Concern for acute leukemia or CMML.  He recommends further evaluation and bone marrow biopsy.     Orders placed for bone marrow biopsy. She will likely require platelet transfusion same-day/prior to bone marrow biopsy procedure; we are working to coordinate for urgent referral to another [facility] center for image-guided bone marrow biopsy.  I will call patient with these results when we have solidified the plan regarding where/when we can get her bone marrow biopsy done.     Mike Craze, NP [place] [phone]

## 2017-01-26 ENCOUNTER — Other Ambulatory Visit: Payer: Self-pay | Admitting: Student

## 2017-01-26 ENCOUNTER — Other Ambulatory Visit (HOSPITAL_COMMUNITY)
Admission: RE | Admit: 2017-01-26 | Discharge: 2017-01-26 | Disposition: A | Discharge status: Home / Self Care | Payer: Medicare Other | Source: Ambulatory Visit | Attending: Oncology | Admitting: Oncology

## 2017-01-26 ENCOUNTER — Ambulatory Visit
Admission: RE | Admit: 2017-01-26 | Discharge: 2017-01-26 | Disposition: A | Discharge status: Home / Self Care | Payer: Medicare Other | Source: Ambulatory Visit | Attending: Adult Health | Admitting: Adult Health

## 2017-01-26 ENCOUNTER — Ambulatory Visit (HOSPITAL_COMMUNITY): Admission: RE | Admit: 2017-01-26 | Payer: Medicare Other | Source: Ambulatory Visit

## 2017-01-26 DIAGNOSIS — C9200 Acute myeloblastic leukemia, not having achieved remission: Secondary | ICD-10-CM | POA: Diagnosis not present

## 2017-01-26 DIAGNOSIS — D72821 Monocytosis (symptomatic): Secondary | ICD-10-CM | POA: Diagnosis not present

## 2017-01-26 DIAGNOSIS — D696 Thrombocytopenia, unspecified: Secondary | ICD-10-CM | POA: Diagnosis present

## 2017-01-26 HISTORY — DX: Personal history of urinary calculi: Z87.442

## 2017-01-26 HISTORY — DX: Dyspnea, unspecified: R06.00

## 2017-01-26 HISTORY — DX: Unspecified osteoarthritis, unspecified site: M19.90

## 2017-01-26 LAB — BASIC METABOLIC PANEL
Anion gap: 9 (ref 5–15)
BUN: 24 mg/dL — AB (ref 6–20)
CHLORIDE: 102 mmol/L (ref 101–111)
CO2: 27 mmol/L (ref 22–32)
CREATININE: 0.95 mg/dL (ref 0.44–1.00)
Calcium: 9.8 mg/dL (ref 8.9–10.3)
GFR calc Af Amer: >60 mL/min (ref >60)
GFR calc non Af Amer: 57 mL/min — ABNORMAL LOW (ref >60)
GLUCOSE: 174 mg/dL — AB (ref 65–99)
Potassium: 3.6 mmol/L (ref 3.5–5.1)
Sodium: 138 mmol/L (ref 135–145)

## 2017-01-26 LAB — ABO/RH: ABO/RH(D): O POS

## 2017-01-26 MED ORDER — HEPARIN SOD (PORK) LOCK FLUSH 100 UNIT/ML IV SOLN
INTRAVENOUS | Status: AC
  Filled 2017-01-26: qty 5

## 2017-01-26 MED ORDER — HEPARIN SOD (PORK) LOCK FLUSH 100 UNIT/ML IV SOLN
500.0000 [IU] | Freq: Every day | INTRAVENOUS | Status: DC | PRN
  Filled 2017-01-26: qty 5

## 2017-01-26 MED ORDER — SODIUM CHLORIDE 0.9% FLUSH
10.0000 mL | INTRAVENOUS | Status: DC | PRN
Start: 2017-01-26 — End: 2017-01-27

## 2017-01-26 MED ORDER — BUPIVACAINE HCL (PF) 0.25 % IJ SOLN
INTRAMUSCULAR | Status: AC | PRN
  Administered 2017-01-26: 14 mL

## 2017-01-26 MED ORDER — FENTANYL CITRATE (PF) 100 MCG/2ML IJ SOLN
INTRAMUSCULAR | Status: AC
  Filled 2017-01-26: qty 2

## 2017-01-26 MED ORDER — MIDAZOLAM HCL 5 MG/5ML IJ SOLN
INTRAMUSCULAR | Status: AC
  Filled 2017-01-26: qty 5

## 2017-01-26 MED ORDER — HEPARIN SOD (PORK) LOCK FLUSH 100 UNIT/ML IV SOLN
250.0000 [IU] | INTRAVENOUS | Status: DC | PRN
  Filled 2017-01-26: qty 3

## 2017-01-26 MED ORDER — ACETAMINOPHEN 325 MG PO TABS
650.0000 mg | ORAL_TABLET | Freq: Four times a day (QID) | ORAL | Status: DC | PRN
  Filled 2017-01-26: qty 2

## 2017-01-26 MED ORDER — ACETAMINOPHEN 325 MG PO TABS
650.0000 mg | ORAL_TABLET | Freq: Once | ORAL | Status: DC
  Filled 2017-01-26: qty 2

## 2017-01-26 MED ORDER — BUPIVACAINE HCL (PF) 0.25 % IJ SOLN
INTRAMUSCULAR | Status: AC
  Filled 2017-01-26: qty 30

## 2017-01-26 MED ORDER — FENTANYL CITRATE (PF) 100 MCG/2ML IJ SOLN
INTRAMUSCULAR | Status: AC | PRN
  Administered 2017-01-26: 25 ug via INTRAVENOUS

## 2017-01-26 MED ORDER — SODIUM CHLORIDE 0.9 % IV SOLN: 250.0000 mL | Freq: Once | INTRAVENOUS | Status: DC

## 2017-01-26 MED ORDER — DIPHENHYDRAMINE HCL 25 MG PO CAPS
25.0000 mg | ORAL_CAPSULE | Freq: Once | ORAL | Status: DC
  Filled 2017-01-26: qty 1

## 2017-01-26 MED ORDER — SODIUM CHLORIDE 0.9% FLUSH: 3.0000 mL | INTRAVENOUS | Status: DC | PRN

## 2017-01-26 NOTE — Procedures (Signed)
CT Bone marrow bx without difficulty  Complications:  None  Blood Loss: none  See dictation in canopy pacs  

## 2017-01-27 ENCOUNTER — Telehealth (HOSPITAL_COMMUNITY): Payer: Self-pay | Admitting: Adult Health

## 2017-01-27 LAB — CBC WITH DIFFERENTIAL/PLATELET
BAND NEUTROPHILS: 0 %
BLASTS: 36 % — AB
Basophils Absolute: 0 10*3/uL (ref 0–0.1)
Basophils Relative: 0 %
Eosinophils Absolute: 0 10*3/uL (ref 0–0.7)
Eosinophils Relative: 0 %
HEMATOCRIT: 34.1 % — AB (ref 35.0–47.0)
Hemoglobin: 11.5 g/dL — ABNORMAL LOW (ref 12.0–16.0)
LYMPHS PCT: 24 %
Lymphs Abs: 2 10*3/uL (ref 1.0–3.6)
MCH: 31.3 pg (ref 26.0–34.0)
MCHC: 33.7 g/dL (ref 32.0–36.0)
MCV: 93 fL (ref 80.0–100.0)
MONOS PCT: 14 %
Metamyelocytes Relative: 2 %
Monocytes Absolute: 1.2 10*3/uL — ABNORMAL HIGH (ref 0.2–0.9)
Myelocytes: 0 %
NEUTROS ABS: 2.6 10*3/uL (ref 1.4–6.5)
Neutrophils Relative %: 26 %
OTHER: 0 %
Platelets: 25 10*3/uL — CL (ref 150–440)
Promyelocytes Absolute: 0 %
RBC: 3.66 MIL/uL — AB (ref 3.80–5.20)
RDW: 15.3 % — AB (ref 11.5–14.5)
WBC: 8.5 10*3/uL (ref 3.6–11.0)
nRBC: 0 /100 WBC

## 2017-01-27 LAB — BPAM PLATELET PHERESIS
BLOOD PRODUCT EXPIRATION DATE: 201808301108
ISSUE DATE / TIME: 201808291126
Unit Type and Rh: 7300

## 2017-01-27 LAB — PREPARE PLATELET PHERESIS: Unit division: 0

## 2017-01-27 NOTE — Telephone Encounter (Signed)
I received a call from Dr. Gari Crown from pathology with preliminary results from patient's bone marrow biopsy performed yesterday.   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   ---  I spoke with Dr. Lissa Merlin on the leukemia service at [facility] regarding need for immediate admission, evaluation, and treatment.  Dr. Lissa Merlin is concerned that there may not be a bed available for the patient tomorrow, so she is making arrangements for the patient to be a direct admit to [facility] today, 01/27/17.   ---  I called and spoke directly with the patient. I shared with her the results of the bone marrow biopsy, which are concerning for acute leukemia.  I stressed the significance of this diagnosis and the need for immediate evaluation and treatment.  Shared with her that she will receive a call from [facility] today with further instructions when they have a bed available for direct admission.  Patient was able to tell me the plan and understands the importance of going to [facility] when she is called.  Encouraged her to pack belongings and be prepared for potential lengthy hospitalization.  She understands that things will move very quickly to get her what she needs and recommended she bring a family member with her for additional support.    She voiced understanding and agrees with this plan.    Mike Craze, NP [place] [phone]

## 2017-02-01 ENCOUNTER — Telehealth (HOSPITAL_COMMUNITY): Payer: Self-pay | Admitting: Oncology

## 2017-02-01 NOTE — Telephone Encounter (Signed)
Called patient today to discuss her bone marrow biopsy results. 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 No one answered. Left voice mail for her to call me back.

## 2017-02-02 ENCOUNTER — Ambulatory Visit (HOSPITAL_COMMUNITY): Payer: Medicare Other

## 2017-02-14 DIAGNOSIS — T8089XA Other complications following infusion, transfusion and therapeutic injection, initial encounter: Secondary | ICD-10-CM | POA: Insufficient documentation

## 2017-02-21 ENCOUNTER — Telehealth: Payer: Self-pay | Admitting: Internal Medicine

## 2017-02-21 ENCOUNTER — Encounter (HOSPITAL_COMMUNITY): Payer: Self-pay

## 2017-02-21 ENCOUNTER — Other Ambulatory Visit (HOSPITAL_COMMUNITY): Payer: Medicare Other

## 2017-02-21 ENCOUNTER — Encounter: Payer: Self-pay | Admitting: Internal Medicine

## 2017-02-21 ENCOUNTER — Ambulatory Visit (HOSPITAL_COMMUNITY): Payer: Medicare Other

## 2017-02-21 NOTE — Telephone Encounter (Signed)
PATIENT SCHEDULED  °

## 2017-02-21 NOTE — Telephone Encounter (Signed)
[phone] °

## 2017-02-21 NOTE — Telephone Encounter (Signed)
Spoke to Dr. Arnold Long at [facility] Unfortunately, recently diagnosed with AML.  Just finished induction therapy. Now with diarrhea. 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 Was told patient does not have C.  Diff  I recommended Entocort 6 mg daily until we see her back in the office in 3-4 weeks.

## 2017-02-25 LAB — CHROMOSOME ANALYSIS, BONE MARROW

## 2017-02-28 ENCOUNTER — Inpatient Hospital Stay
Admission: RE | Admit: 2017-02-28 | Discharge: 2017-03-20 | Disposition: A | Discharge status: Other Acute Inpatient Hospital | Payer: Medicare Other | Source: Ambulatory Visit | Attending: Internal Medicine | Admitting: Internal Medicine

## 2017-03-01 ENCOUNTER — Encounter: Payer: Self-pay | Admitting: Internal Medicine

## 2017-03-01 ENCOUNTER — Non-Acute Institutional Stay (SKILLED_NURSING_FACILITY): Payer: Medicare Other | Admitting: Internal Medicine

## 2017-03-01 DIAGNOSIS — B029 Zoster without complications: Secondary | ICD-10-CM

## 2017-03-01 DIAGNOSIS — E7800 Pure hypercholesterolemia, unspecified: Secondary | ICD-10-CM

## 2017-03-01 DIAGNOSIS — B019 Varicella without complication: Secondary | ICD-10-CM

## 2017-03-01 DIAGNOSIS — C9201 Acute myeloblastic leukemia, in remission: Secondary | ICD-10-CM | POA: Diagnosis not present

## 2017-03-01 DIAGNOSIS — Z794 Long term (current) use of insulin: Secondary | ICD-10-CM | POA: Diagnosis not present

## 2017-03-01 DIAGNOSIS — K52832 Lymphocytic colitis: Secondary | ICD-10-CM

## 2017-03-01 DIAGNOSIS — E119 Type 2 diabetes mellitus without complications: Secondary | ICD-10-CM

## 2017-03-01 DIAGNOSIS — C9200 Acute myeloblastic leukemia, not having achieved remission: Secondary | ICD-10-CM | POA: Insufficient documentation

## 2017-03-01 DIAGNOSIS — I10 Essential (primary) hypertension: Secondary | ICD-10-CM | POA: Diagnosis not present

## 2017-03-01 DIAGNOSIS — K219 Gastro-esophageal reflux disease without esophagitis: Secondary | ICD-10-CM

## 2017-03-01 NOTE — Progress Notes (Signed)
Provider:  Veleta Miners Location:   [facility] Room Number: 151/P Place of Service:  SNF (31)  PCP: Pomposini, Cherly Anderson, MD Patient Care Team: Pomposini, Cherly Anderson, MD as PCP - General (Internal Medicine) Gala Romney, Cristopher Estimable, MD as Consulting Physician (Gastroenterology)  Extended Emergency Contact Information Primary Emergency Contact: Theodosia Paling Address: [place] [address] [name] of [place] Phone: [phone] Mobile Phone: [phone] Relation: Spouse Secondary Emergency Contact: Caprice Renshaw States of [place] Phone: [phone] Mobile Phone: [phone] Relation: Sister  Code Status: Full Code Goals of Care: Advanced Directive information Advanced Directives 03/01/2017  Does Patient Have a Medical Advance Directive? Yes  Type of Advance Directive (No Data)  Does patient want to make changes to medical advance directive? No - Patient declined  Copy of [place] in Chart? -  Would patient like information on creating a medical advance directive? -      Chief Complaint  Patient presents with  . New Admit To SNF    New Admission Visit,    HPI: Patient is a 76 y.o. female seen today for admission to SNF for therapy.  Patient has h/o Hypertension, Diabetes Type 2, GERD, Hyperlipidemia, Lymphocytic Colitis who was admitted to the [facility] on 08/30-10/01 with Thrombocytopenia and Acute Leukemia.   She underwent Bone marrow Biopsy on 08/29 and was found to have 40-50 % Blasts.And diagnosis of AML. She was started on Induction Chemotherapy and was enrolled in Selinexor Trial.  Nadir bone marrow on 9/13 revealed hypocellular marrow with markedly decreased trilineage hematopoiesis and mildly increased blasts consistent with residual leukemia. However, re-induction chemotherapy was held secondary to patient being too ill to continue to chemotherapy  Her course in the hospital was  complicated by Hypotension, oral Candidiasis, RUE DVT,  ? Cefepime induced Encephalopathy, Drug Rash and Varicella Zoster infection. She also had Severe Pancytopenia due to Chemotherapy requiring Transfusions of Platelets and Blood products. Right Axillary DVT was not treated due to risk of bleeding with Anticoagulation. Patient also had Neutropenic Fever and was started on IV cefepime Which cause her to have Encephalopathy. She also had Drug Rash to Vancomycin. Patient also had Colitis Flare and was treated with Budesonide.  Patient is doing well in facility. Her main complain is weakness in her Leg Left more then right. She continues to feel Tired.  Also C/o Increased urinary frequency and Urgency. No Dysuria or Fever. She was very independent before surgery. Driving . Lives with her husband. Wants to go home as soon as she is able to do transfers.   Past Medical History:  Diagnosis Date  . Arthritis   . Back pain   . Diabetes ([place])   . Dyspnea   . GERD (gastroesophageal reflux disease)   . Glaucoma   . History of kidney stones   . Hypercholesterolemia   . Hypertension   . Lymphocytic colitis 2015  . Scoliosis   . Tubular adenoma 2015   Past Surgical History:  Procedure Laterality Date  . ABDOMINAL HYSTERECTOMY     and bladder tac  . BACK SURGERY     spinal stenosis  . BALLOON DILATION N/A 05/04/2014   Procedure: BALLOON DILATION;  Surgeon: Rogene Houston, MD;  Location: [facility];  Service: Endoscopy;  Laterality: N/A;  . CATARACT EXTRACTION, BILATERAL    . CHOLECYSTECTOMY N/A 05/27/2014   Procedure: LAPAROSCOPIC CHOLECYSTECTOMY;  Surgeon: Jamesetta So, MD;  Location: [facility];  Service: General;  Laterality: N/A;  . COLONOSCOPY  Oct 2012   Dr. Elder Cyphers: few small rectal polyps s/p biopsy, adenomatous polyps  . COLONOSCOPY N/A 04/09/2014   Dr.Rourk- colonic diverticulosis,polyp removed, subtly abnormal colonic mucosa bx=lymphocytic colitis and tubular adenoma  . ERCP N/A  05/04/2014   Dr. Karilyn Cota- cystic duct was patent, biliary sphincterotomy performed and single pigmented stome removed with stone balloon extractor  . EYE SURGERY    . GALLBLADDER SURGERY    . GANGLION CYST EXCISION Right   . REMOVAL OF STONES N/A 05/04/2014   Procedure: REMOVAL OF STONES;  Surgeon: Malissa Hippo, MD;  Location: [facility];  Service: Endoscopy;  Laterality: N/A;  . SPHINCTEROTOMY N/A 05/04/2014   Procedure: SPHINCTEROTOMY;  Surgeon: Malissa Hippo, MD;  Location: [facility];  Service: Endoscopy;  Laterality: N/A;  . TONSILLECTOMY     age 20    reports that she quit smoking about 12 years ago. Her smoking use included E-cigarettes. She has a 12.50 pack-year smoking history. She has never used smokeless tobacco. She reports that she does not drink alcohol or use drugs. Social History   Social History  . Marital status: Married    Spouse name: N/A  . Number of children: N/A  . Years of education: N/A   Occupational History  . Retired Charity fundraiser    Social History Main Topics  . Smoking status: Former Smoker    Packs/day: 0.50    Years: 25.00    Types: E-cigarettes    Quit date: 02/20/2005  . Smokeless tobacco: Never Used     Comment: E-Cigarette  . Alcohol use No  . Drug use: No  . Sexual activity: Yes    Birth control/ protection: Surgical   Other Topics Concern  . Not on file   Social History Narrative  . No narrative on file    Functional Status Survey:    Family History  Problem Relation Age of Onset  . Diverticulitis Mother   . Ulcers Mother   . Colon cancer Neg Hx     Health Maintenance  Topic Date Due  . INFLUENZA VACCINE  04/01/2017 (Originally 12/29/2016)  . FOOT EXAM  04/01/2017 (Originally 06/23/1950)  . HEMOGLOBIN A1C  04/01/2017 (Originally Dec 29, 1940)  . OPHTHALMOLOGY EXAM  04/01/2017 (Originally 06/23/1950)  . URINE MICROALBUMIN  04/01/2017 (Originally 06/23/1950)  . TETANUS/TDAP  04/01/2017 (Originally 06/22/1960)  . PNA vac Low Risk Adult (1 of 2 -  PCV13) 04/01/2017 (Originally 06/22/2006)  . COLONOSCOPY  04/09/2024  . DEXA SCAN  Completed    Allergies  Allergen Reactions  . Byetta 10 Mcg Pen  [Exenatide]   . Codeine     Patient stated that she does not like taking any codeine   . Erythromycin Nausea And Vomiting  . Metformin   . Pioglitazone     Outpatient Encounter Prescriptions as of 03/01/2017  Medication Sig  . acetaminophen (TYLENOL) 500 MG tablet Take 1,000 mg by mouth every 6 (six) hours as needed for mild pain.  Marland Kitchen amLODipine (NORVASC) 5 MG tablet Take 5 mg by mouth daily.  . budesonide (ENTOCORT EC) 3 MG 24 hr capsule Take 6 mg by mouth daily.  . carvedilol (COREG) 3.125 MG tablet Take 3.125 mg by mouth 2 (two) times daily with a meal.  . Cholecalciferol (VITAMIN D) 2000 units CAPS Take 1 capsule by mouth daily.  Marland Kitchen esomeprazole (NEXIUM) 20 MG capsule Take 20 mg by mouth daily at 12 noon.  Marland Kitchen  insulin glargine (LANTUS) 100 UNIT/ML injection Inject 15 Units into the skin at bedtime.  . insulin lispro (HUMALOG KWIKPEN) 100 UNIT/ML KiwkPen Per sliding scale three times a day  . loperamide (IMODIUM A-D) 2 MG tablet Take 2 mg by mouth every 8 (eight) hours as needed for diarrhea or loose stools.  . mirtazapine (REMERON) 7.5 MG tablet Take 7.5 mg by mouth at bedtime.  . Multiple Vitamin (MULTIVITAMIN WITH MINERALS) TABS tablet Take 1 tablet by mouth daily.  . ondansetron (ZOFRAN) 8 MG tablet Take 8 mg by mouth every 8 (eight) hours as needed for nausea or vomiting.  . rosuvastatin (CRESTOR) 10 MG tablet Take 10 mg by mouth every evening.   . traMADol (ULTRAM) 50 MG tablet Take 1-2 mg by mouth every 6 (six) hours as needed for moderate pain or severe pain.  . [DISCONTINUED] carvedilol (COREG) 12.5 MG tablet Take 12.5 mg by mouth 2 (two) times daily.  . [DISCONTINUED] HYDROcodone-acetaminophen (NORCO/VICODIN) 5-325 MG tablet Take 1 tablet by mouth every 6 (six) hours as needed for severe pain.  . [DISCONTINUED] insulin aspart  protamine - aspart (NOVOLOG MIX 70/30 FLEXPEN) (70-30) 100 UNIT/ML FlexPen Inject 20-25 Units into the skin 2 (two) times daily. 25 units in the morning and 20 units in the evening  . [DISCONTINUED] predniSONE (DELTASONE) 10 MG tablet Take '50mg'$  PO daily for 7 days then '40mg'$  daily for 7 days then 30 mg daily for 7 days then '20mg'$  daily for 7 days then '10mg'$  daily for 7 days.  . [DISCONTINUED] Probiotic Product (PROBIOTIC DAILY PO) Take 1 tablet by mouth daily.   No facility-administered encounter medications on file as of 03/01/2017.      Review of Systems  Constitutional: Positive for activity change, appetite change and fatigue.  Cardiovascular: Positive for leg swelling.  Genitourinary: Positive for frequency and urgency.  Skin: Positive for rash.  All other systems reviewed and are negative.   Vitals:   03/01/17 1305  BP: 110/66  Pulse: 74  Resp: 20  Temp: 97.6 F (36.4 C)  SpO2: 99%   There is no height or weight on file to calculate BMI. Physical Exam  Constitutional: She is oriented to person, place, and time. She appears well-developed and well-nourished.  HENT:  Head: Normocephalic.  Mouth/Throat: Oropharynx is clear and moist.  Eyes: Pupils are equal, round, and reactive to light.  Neck: Neck supple.  Cardiovascular: Normal rate, regular rhythm and normal heart sounds.   Pulmonary/Chest: Effort normal and breath sounds normal. No respiratory distress. She has no wheezes. She has no rales.  Abdominal: Soft. Bowel sounds are normal. She exhibits no distension. There is no tenderness. There is no rebound.  Musculoskeletal:  Mild edema bilateral with Vesicular rash below knee in Left LE  Neurological: She is alert and oriented to person, place, and time.  Patient has Weakness in her left LE. 3-4/5. Right UE is normal Strength and LE is 4/5  Skin: Skin is warm and dry.  Vesicular   Psychiatric: She has a normal mood and affect. Her behavior is normal. Judgment and thought  content normal.    Labs reviewed: Basic Metabolic Panel:  Recent Labs  01/18/17 1733 01/21/17 1437 01/26/17 0816  NA 135 142 138  K 3.4* 3.5 3.6  CL 99* 105 102  CO2 '25 27 27  '$ GLUCOSE 176* 112* 174*  BUN 18 23* 24*  CREATININE 1.00 0.81 0.95  CALCIUM 9.3 9.8 9.8   Liver Function Tests:  Recent Labs  01/18/17 1733 01/21/17 1437  AST 23 37  ALT 17 46  ALKPHOS 90 81  BILITOT 1.1 0.5  PROT 7.8 7.1  ALBUMIN 3.7 3.6   No results for input(s): LIPASE, AMYLASE in the last 8760 hours. No results for input(s): AMMONIA in the last 8760 hours. CBC:  Recent Labs  01/18/17 1733 01/18/17 1819 01/21/17 1437 01/26/17 0816  WBC 10.8*  --  10.1 8.5  NEUTROABS 1.1*  --  1.3* 2.6  HGB 10.9*  --  9.9* 11.5*  HCT 33.0*  --  30.3* 34.1*  MCV 92.7  --  93.8 93.0  PLT 19* 18* 17* 25*   Cardiac Enzymes: No results for input(s): CKTOTAL, CKMB, CKMBINDEX, TROPONINI in the last 8760 hours. BNP: Invalid input(s): POCBNP No results found for: HGBA1C Lab Results  Component Value Date   TSH 1.759 01/21/2017   Lab Results  Component Value Date   [id number] 893 01/21/2017   Lab Results  Component Value Date   FOLATE 31.0 01/21/2017   No results found for: IRON, TIBC, FERRITIN  Imaging and Procedures obtained prior to SNF admission: No results found.  Assessment/Plan  Acute myeloid leukemia Patient has follow up with Hematology in [facility]. According to Patient her Repeat Bone marrow after therapy shows she is in remission.    Essential hypertension Patient had Some Hypotension in the hospital but has been Stable on Norvasc and Coreg here.  Lymphocytic colitis On Budesonide. Asymptomatic Now. Follow up with Dr Gala Romney. Already arranged   Varicella zoster Infectious rash in LLE Also has weakness in that leg. Already treated in the hospital Plan to continue therapy  Hypercholesterolemia Continue her Statin  Type 2 diabetes mellitus  BS more then 300 and 200 this  morning . Will increase her Lantus to 20 units Continue Accu Check and Sliding Scale. Urinary frequency Will check UA Decreased appetite She was started on Remeron in [facility] GERD Continue on Nexium. Right Axillary Vein Arm looks fine with no more swelling No anticoagulation right now. Oral Candidiasis Resolved right now. Deconditioning Work with therapy with possible discharge home. Family/ staff Communication:   Labs/tests ordered: CMP, CBC, TSH UA  Total time spent in this patient care encounter was 45_ minutes; greater than 50% of the visit spent counseling patient, reviewing records , Labs and coordinating care for problems addressed at this encounter.

## 2017-03-02 ENCOUNTER — Encounter (HOSPITAL_COMMUNITY)
Admission: RE | Admit: 2017-03-02 | Discharge: 2017-03-02 | Disposition: A | Discharge status: Home / Self Care | Payer: Medicare Other | Source: Skilled Nursing Facility | Attending: Internal Medicine | Admitting: Internal Medicine

## 2017-03-02 DIAGNOSIS — D61810 Antineoplastic chemotherapy induced pancytopenia: Secondary | ICD-10-CM | POA: Insufficient documentation

## 2017-03-02 DIAGNOSIS — B029 Zoster without complications: Secondary | ICD-10-CM | POA: Insufficient documentation

## 2017-03-02 DIAGNOSIS — C9300 Acute monoblastic/monocytic leukemia, not having achieved remission: Secondary | ICD-10-CM | POA: Insufficient documentation

## 2017-03-02 DIAGNOSIS — B370 Candidal stomatitis: Secondary | ICD-10-CM | POA: Insufficient documentation

## 2017-03-02 LAB — URINALYSIS, ROUTINE W REFLEX MICROSCOPIC
BILIRUBIN URINE: NEGATIVE
Bacteria, UA: NONE SEEN
Glucose, UA: NEGATIVE mg/dL
HGB URINE DIPSTICK: NEGATIVE
Ketones, ur: NEGATIVE mg/dL
LEUKOCYTES UA: NEGATIVE
NITRITE: NEGATIVE
PH: 8 (ref 5.0–8.0)
Protein, ur: 30 mg/dL — AB
SPECIFIC GRAVITY, URINE: 1.008 (ref 1.005–1.030)

## 2017-03-02 LAB — COMPREHENSIVE METABOLIC PANEL
ALK PHOS: 93 U/L (ref 38–126)
ALT: 33 U/L (ref 14–54)
ANION GAP: 5 (ref 5–15)
AST: 29 U/L (ref 15–41)
Albumin: 2.8 g/dL — ABNORMAL LOW (ref 3.5–5.0)
BUN: 12 mg/dL (ref 6–20)
CALCIUM: 8.7 mg/dL — AB (ref 8.9–10.3)
CHLORIDE: 105 mmol/L (ref 101–111)
CO2: 29 mmol/L (ref 22–32)
CREATININE: 0.73 mg/dL (ref 0.44–1.00)
Glucose, Bld: 159 mg/dL — ABNORMAL HIGH (ref 65–99)
Potassium: 2.7 mmol/L — CL (ref 3.5–5.1)
Sodium: 139 mmol/L (ref 135–145)
Total Bilirubin: 0.9 mg/dL (ref 0.3–1.2)
Total Protein: 5.6 g/dL — ABNORMAL LOW (ref 6.5–8.1)

## 2017-03-02 LAB — CBC
HCT: 28.1 % — ABNORMAL LOW (ref 36.0–46.0)
HEMOGLOBIN: 9.3 g/dL — AB (ref 12.0–15.0)
MCH: 31.8 pg (ref 26.0–34.0)
MCHC: 33.1 g/dL (ref 30.0–36.0)
MCV: 96.2 fL (ref 78.0–100.0)
PLATELETS: 612 10*3/uL — AB (ref 150–400)
RBC: 2.92 MIL/uL — AB (ref 3.87–5.11)
RDW: 19.1 % — ABNORMAL HIGH (ref 11.5–15.5)
WBC: 6.6 10*3/uL (ref 4.0–10.5)

## 2017-03-04 ENCOUNTER — Encounter (HOSPITAL_COMMUNITY)
Admission: RE | Admit: 2017-03-04 | Discharge: 2017-03-04 | Disposition: A | Discharge status: Home / Self Care | Payer: Medicare Other | Source: Skilled Nursing Facility | Attending: Internal Medicine | Admitting: Internal Medicine

## 2017-03-04 DIAGNOSIS — E119 Type 2 diabetes mellitus without complications: Secondary | ICD-10-CM | POA: Insufficient documentation

## 2017-03-04 DIAGNOSIS — B029 Zoster without complications: Secondary | ICD-10-CM | POA: Insufficient documentation

## 2017-03-04 DIAGNOSIS — C9300 Acute monoblastic/monocytic leukemia, not having achieved remission: Secondary | ICD-10-CM | POA: Insufficient documentation

## 2017-03-04 DIAGNOSIS — B370 Candidal stomatitis: Secondary | ICD-10-CM | POA: Insufficient documentation

## 2017-03-04 DIAGNOSIS — D61810 Antineoplastic chemotherapy induced pancytopenia: Secondary | ICD-10-CM | POA: Insufficient documentation

## 2017-03-04 LAB — BASIC METABOLIC PANEL
ANION GAP: 8 (ref 5–15)
BUN: 14 mg/dL (ref 6–20)
CALCIUM: 9.3 mg/dL (ref 8.9–10.3)
CO2: 26 mmol/L (ref 22–32)
Chloride: 104 mmol/L (ref 101–111)
Creatinine, Ser: 0.79 mg/dL (ref 0.44–1.00)
GFR calc Af Amer: >60 mL/min (ref >60)
GLUCOSE: 191 mg/dL — AB (ref 65–99)
POTASSIUM: 4.3 mmol/L (ref 3.5–5.1)
SODIUM: 138 mmol/L (ref 135–145)

## 2017-03-04 LAB — URINE CULTURE

## 2017-03-07 ENCOUNTER — Encounter (HOSPITAL_COMMUNITY)
Admission: RE | Admit: 2017-03-07 | Discharge: 2017-03-07 | Disposition: A | Discharge status: Home / Self Care | Payer: Medicare Other | Source: Skilled Nursing Facility | Attending: *Deleted | Admitting: *Deleted

## 2017-03-07 LAB — BASIC METABOLIC PANEL
Anion gap: 10 (ref 5–15)
BUN: 14 mg/dL (ref 6–20)
CO2: 26 mmol/L (ref 22–32)
Calcium: 9.4 mg/dL (ref 8.9–10.3)
Chloride: 102 mmol/L (ref 101–111)
Creatinine, Ser: 0.81 mg/dL (ref 0.44–1.00)
Glucose, Bld: 146 mg/dL — ABNORMAL HIGH (ref 65–99)
POTASSIUM: 4.7 mmol/L (ref 3.5–5.1)
SODIUM: 138 mmol/L (ref 135–145)

## 2017-03-08 ENCOUNTER — Non-Acute Institutional Stay (SKILLED_NURSING_FACILITY): Payer: Medicare Other | Admitting: Internal Medicine

## 2017-03-08 ENCOUNTER — Encounter: Payer: Self-pay | Admitting: Internal Medicine

## 2017-03-08 DIAGNOSIS — B019 Varicella without complication: Secondary | ICD-10-CM | POA: Diagnosis not present

## 2017-03-08 DIAGNOSIS — B029 Zoster without complications: Secondary | ICD-10-CM | POA: Diagnosis not present

## 2017-03-08 DIAGNOSIS — Z794 Long term (current) use of insulin: Secondary | ICD-10-CM

## 2017-03-08 DIAGNOSIS — E119 Type 2 diabetes mellitus without complications: Secondary | ICD-10-CM

## 2017-03-08 DIAGNOSIS — C9201 Acute myeloblastic leukemia, in remission: Secondary | ICD-10-CM

## 2017-03-08 DIAGNOSIS — K52832 Lymphocytic colitis: Secondary | ICD-10-CM | POA: Diagnosis not present

## 2017-03-08 NOTE — Progress Notes (Signed)
Location:   [facility] Room Number: 151/P Place of Service:  SNF (31) Provider:  Orland Dec, MD  Patient Care Team: Pomposini, Cherly Anderson, MD as PCP - General (Internal Medicine) Diane Romney Cristopher Estimable, MD as Consulting Physician (Gastroenterology)  Extended Emergency Contact Information Primary Emergency Contact: Theodosia Paling Address: [place] [address] [name] of [place] Phone: [phone] Mobile Phone: [phone] Relation: Spouse Secondary Emergency Contact: Caprice Renshaw States of [place] Phone: [phone] Mobile Phone: [phone] Relation: Sister  Code Status:  Full Code Goals of care: Advanced Directive information Advanced Directives 03/08/2017  Does Patient Have a Medical Advance Directive? Yes  Type of Advance Directive (No Data)  Does patient want to make changes to medical advance directive? No - Patient declined  Copy of [place] in Chart? -  Would patient like information on creating a medical advance directive? -     Chief Complaint  Patient presents with  . Acute Visit    Review Medication    HPI:  Pt is a 76 y.o. female seen today for an acute visit to review patients Meds and Persistent Tingling and weakness in left leg.   Patient has h/o Hypertension, Diabetes Type 2, GERD, Hyperlipidemia, Lymphocytic Colitis who was admitted to the [facility] on 08/30-10/01 with Thrombocytopenia and Acute Leukemia and was diagnosed with AML. She was treated with Extensive Chemo and had course complicated by Hypotension, oral Candidiasis, RUE DVT,  ? Cefepime induced Encephalopathy, Drug Rash and Varicella Zoster infection. She also had Severe Pancytopenia due to Chemotherapy requiring Transfusions of Platelets and Blood products.  Patient has been stable in facility and is working with the therapy to get her endurance back. Her main complain was tingling  sensation in her Left LE. She continues to have weakness in that leg. She also wanted me to go over her list of meds. And wanted to know if her family can bring OTC multivitamin and Magnesium supplement which would be easy to swallow. Patient continues to loose weight and has lost 10 bs since she has been in facility. Her appetite stays poor. She is trying to supplement her self with Ensure.    Past Medical History:  Diagnosis Date  . Arthritis   . Back pain   . Diabetes ([place])   . Dyspnea   . GERD (gastroesophageal reflux disease)   . Glaucoma   . History of kidney stones   . Hypercholesterolemia   . Hypertension   . Lymphocytic colitis 2015  . Scoliosis   . Tubular adenoma 2015   Past Surgical History:  Procedure Laterality Date  . ABDOMINAL HYSTERECTOMY     and bladder tac  . BACK SURGERY     spinal stenosis  . BALLOON DILATION N/A 05/04/2014   Procedure: BALLOON DILATION;  Surgeon: Rogene Houston, MD;  Location: [facility];  Service: Endoscopy;  Laterality: N/A;  . CATARACT EXTRACTION, BILATERAL    . CHOLECYSTECTOMY N/A 05/27/2014   Procedure: LAPAROSCOPIC CHOLECYSTECTOMY;  Surgeon: Jamesetta So, MD;  Location: [facility];  Service: General;  Laterality: N/A;  . COLONOSCOPY  Oct 2012   Dr. Algis Greenhouse: few small rectal polyps s/p biopsy, adenomatous polyps  . COLONOSCOPY N/A 04/09/2014   Dr.Rourk- colonic diverticulosis,polyp removed, subtly abnormal colonic mucosa bx=lymphocytic colitis and tubular adenoma  . ERCP N/A 05/04/2014   Dr. Laural Golden- cystic duct was patent, biliary sphincterotomy performed and  single pigmented stome removed with stone balloon extractor  . EYE SURGERY    . GALLBLADDER SURGERY    . GANGLION CYST EXCISION Right   . REMOVAL OF STONES N/A 05/04/2014   Procedure: REMOVAL OF STONES;  Surgeon: Rogene Houston, MD;  Location: [facility];  Service: Endoscopy;  Laterality: N/A;  . SPHINCTEROTOMY N/A 05/04/2014   Procedure: SPHINCTEROTOMY;  Surgeon: Rogene Houston, MD;   Location: [facility];  Service: Endoscopy;  Laterality: N/A;  . TONSILLECTOMY     age 81    Allergies  Allergen Reactions  . Byetta 10 Mcg Pen  [Exenatide]   . Codeine     Patient stated that she does not like taking any codeine   . Erythromycin Nausea And Vomiting  . Metformin   . Pioglitazone     Outpatient Encounter Prescriptions as of 03/08/2017  Medication Sig  . acetaminophen (TYLENOL) 500 MG tablet Take 1,000 mg by mouth every 6 (six) hours as needed for mild pain.  Marland Kitchen amLODipine (NORVASC) 5 MG tablet Take 5 mg by mouth daily.  . budesonide (ENTOCORT EC) 3 MG 24 hr capsule Take 6 mg by mouth daily.  . carvedilol (COREG) 3.125 MG tablet Take 3.125 mg by mouth 2 (two) times daily with a meal.  . Cholecalciferol (VITAMIN D) 2000 units CAPS Take 1 capsule by mouth daily.  Marland Kitchen esomeprazole (NEXIUM) 20 MG capsule Take 20 mg by mouth daily at 12 noon.  . insulin glargine (LANTUS) 100 UNIT/ML injection Inject 20 Units into the skin at bedtime.   . insulin lispro (HUMALOG KWIKPEN) 100 UNIT/ML KiwkPen Per sliding scale three times a day  . loperamide (IMODIUM A-D) 2 MG tablet Take 2 mg by mouth every 8 (eight) hours as needed for diarrhea or loose stools.  . magnesium 30 MG tablet Take 30 mg by mouth daily.  . mirtazapine (REMERON) 15 MG tablet Take 15 mg by mouth at bedtime.  . Multiple Vitamin (MULTIVITAMIN WITH MINERALS) TABS tablet Take 1 tablet by mouth daily.  . ondansetron (ZOFRAN) 8 MG tablet Take 8 mg by mouth every 8 (eight) hours as needed for nausea or vomiting.  . potassium chloride 20 MEQ/15ML (10%) SOLN Take 20 mEq by mouth 2 (two) times daily.   . rosuvastatin (CRESTOR) 10 MG tablet Take 10 mg by mouth every evening.   . traMADol (ULTRAM) 50 MG tablet Take 1-2 mg by mouth every 6 (six) hours as needed for moderate pain or severe pain.  . [DISCONTINUED] mirtazapine (REMERON) 7.5 MG tablet Take 7.5 mg by mouth at bedtime.   No facility-administered encounter medications on file  as of 03/08/2017.      Review of Systems  Constitutional: Positive for activity change and appetite change.  HENT: Positive for trouble swallowing.   Gastrointestinal: Positive for diarrhea.  Neurological: Positive for weakness.  All other systems reviewed and are negative.   Immunization History  Administered Date(s) Administered  . Influenza,inj,quad, With Preservative 03/31/2016  . Pneumococcal Conjugate-13 03/31/2013  . Pneumococcal Polysaccharide-23 05/19/2007  . Tdap 06/01/2011  . Zoster 06/01/2011   Pertinent  Health Maintenance Due  Topic Date Due  . INFLUENZA VACCINE  04/01/2017 (Originally 12/29/2016)  . FOOT EXAM  04/01/2017 (Originally 06/23/1950)  . HEMOGLOBIN A1C  04/01/2017 (Originally 12/03/1940)  . OPHTHALMOLOGY EXAM  04/01/2017 (Originally 06/23/1950)  . URINE MICROALBUMIN  04/01/2017 (Originally 06/23/1950)  . COLONOSCOPY  04/09/2024  . DEXA SCAN  Completed  . PNA vac Low Risk Adult  Completed  No flowsheet data found. Functional Status Survey:    Vitals:   03/08/17 1500  BP: 112/64  Pulse: 60  Resp: 17  Temp: 97.9 F (36.6 C)  TempSrc: Oral   There is no height or weight on file to calculate BMI. Physical Exam  Constitutional: She is oriented to person, place, and time. She appears well-developed.  HENT:  Head: Normocephalic.  Mouth/Throat: Oropharynx is clear and moist.  Eyes: Pupils are equal, round, and reactive to light.  Neck: Neck supple.  Cardiovascular: Normal rate.   No murmur heard. Pulmonary/Chest: Effort normal and breath sounds normal. No respiratory distress. She has no wheezes. She has no rales.  Abdominal: Soft. Bowel sounds are normal. She exhibits no distension. There is no tenderness. There is no rebound.  Musculoskeletal:  Mild edema in Left Leg  Neurological: She is alert and oriented to person, place, and time.  Continues to be weak in her Left  leg  Skin: Skin is warm and dry.  Psychiatric: She has a normal mood and  affect. Her behavior is normal. Thought content normal.    Labs reviewed:  Recent Labs  03/02/17 0700 03/04/17 0700 03/07/17 0600  NA 139 138 138  K 2.7* 4.3 4.7  CL 105 104 102  CO2 29 26 26   GLUCOSE 159* 191* 146*  BUN 12 14 14   CREATININE 0.73 0.79 0.81  CALCIUM 8.7* 9.3 9.4    Recent Labs  01/18/17 1733 01/21/17 1437 03/02/17 0700  AST 23 37 29  ALT 17 46 33  ALKPHOS 90 81 93  BILITOT 1.1 0.5 0.9  PROT 7.8 7.1 5.6*  ALBUMIN 3.7 3.6 2.8*    Recent Labs  01/18/17 1733  01/21/17 1437 01/26/17 0816 03/02/17 0700  WBC 10.8*  --  10.1 8.5 6.6  NEUTROABS 1.1*  --  1.3* 2.6  --   HGB 10.9*  --  9.9* 11.5* 9.3*  HCT 33.0*  --  30.3* 34.1* 28.1*  MCV 92.7  --  93.8 93.0 96.2  PLT 19*  < > 17* 25* 612*  < > = values in this interval not displayed. Lab Results  Component Value Date   TSH 1.759 01/21/2017   No results found for: HGBA1C No results found for: CHOL, HDL, LDLCALC, LDLDIRECT, TRIG, CHOLHDL  Significant Diagnostic Results in last 30 days:  No results found.  Assessment/Plan  Acute myeloid leukemia Patient had follow up with Hematology in [facility]. According to Patient her Repeat Bone marrow after therapy shows she is in remission. Plan is for her to be admitted again on 10/22 and have port cath insertion.And Booster therapy .    Essential hypertension Patient had Some Hypotension in the hospital but has been Stable on Norvasc and Coreg here.  Lymphocytic colitis On Budesonide. Asymptomatic Now. Follow up with Dr Diane Romney. Already arranged She does get some episodes of Diarrhea which respond to Imodium PRN  Varicella zoster Involving Left LE. Reassured patient about the weakness in that leg. She is working very hard with therapy to able to walk with walker.  I offered to start patient on low dose of Neurontin for her Tingling sensataion. But patient wants to wait as it has caused sedation last time when she used  it.  Hypercholesterolemia Continue her Statin  Type 2 diabetes mellitus  BS still running high in afternoon . Will increase her Lantus to 22 units.  Decreased appetite She was started on Remeron in [place] [facility] Nutritionist. They are providing supplements and increase  her Calorie and Protein intake. She has lost almost  Lbs from 145 to 134 Lbs. Most likely that was fluid. Keep encouraging PO intake. Also she wants to try Multivitamins from home. And Potassium discontinued.  GERD Continue on Nexium.  Right Axillary Vein Arm looks fine with no more swelling No anticoagulation right now.  Oral Candidiasis Resolved right now.  Deconditioning Work with therapy with possible discharge home.   Family/ staff Communication:   Labs/tests ordered:   Total time spent in this patient care encounter was 45_ minutes; greater than 50% of the visit spent counseling patient, reviewing records , Labs and coordinating care for problems addressed at this encounter.

## 2017-03-11 ENCOUNTER — Encounter (HOSPITAL_COMMUNITY)
Admission: RE | Admit: 2017-03-11 | Discharge: 2017-03-11 | Disposition: A | Discharge status: Home / Self Care | Payer: Medicare Other | Source: Skilled Nursing Facility | Attending: Internal Medicine | Admitting: Internal Medicine

## 2017-03-11 DIAGNOSIS — C9300 Acute monoblastic/monocytic leukemia, not having achieved remission: Secondary | ICD-10-CM | POA: Insufficient documentation

## 2017-03-11 DIAGNOSIS — D696 Thrombocytopenia, unspecified: Secondary | ICD-10-CM | POA: Insufficient documentation

## 2017-03-11 DIAGNOSIS — I82A11 Acute embolism and thrombosis of right axillary vein: Secondary | ICD-10-CM | POA: Insufficient documentation

## 2017-03-11 DIAGNOSIS — D61810 Antineoplastic chemotherapy induced pancytopenia: Secondary | ICD-10-CM | POA: Insufficient documentation

## 2017-03-11 LAB — BASIC METABOLIC PANEL
Anion gap: 8 (ref 5–15)
BUN: 23 mg/dL — ABNORMAL HIGH (ref 6–20)
CO2: 27 mmol/L (ref 22–32)
Calcium: 9.4 mg/dL (ref 8.9–10.3)
Chloride: 103 mmol/L (ref 101–111)
Creatinine, Ser: 1.08 mg/dL — ABNORMAL HIGH (ref 0.44–1.00)
GFR, EST AFRICAN AMERICAN: 57 mL/min — AB (ref >60)
GFR, EST NON AFRICAN AMERICAN: 49 mL/min — AB (ref >60)
Glucose, Bld: 257 mg/dL — ABNORMAL HIGH (ref 65–99)
Potassium: 4.4 mmol/L (ref 3.5–5.1)
Sodium: 138 mmol/L (ref 135–145)

## 2017-03-11 LAB — MAGNESIUM: MAGNESIUM: 1.3 mg/dL — AB (ref 1.7–2.4)

## 2017-03-14 ENCOUNTER — Other Ambulatory Visit (HOSPITAL_COMMUNITY)
Admission: RE | Admit: 2017-03-14 | Discharge: 2017-03-14 | Disposition: A | Discharge status: Home / Self Care | Payer: Medicare Other | Source: Skilled Nursing Facility | Attending: Internal Medicine | Admitting: Internal Medicine

## 2017-03-14 DIAGNOSIS — E119 Type 2 diabetes mellitus without complications: Secondary | ICD-10-CM | POA: Insufficient documentation

## 2017-03-14 LAB — BASIC METABOLIC PANEL
Anion gap: 9 (ref 5–15)
BUN: 18 mg/dL (ref 6–20)
CHLORIDE: 101 mmol/L (ref 101–111)
CO2: 25 mmol/L (ref 22–32)
CREATININE: 0.93 mg/dL (ref 0.44–1.00)
Calcium: 9.4 mg/dL (ref 8.9–10.3)
GFR calc non Af Amer: 59 mL/min — ABNORMAL LOW (ref >60)
Glucose, Bld: 272 mg/dL — ABNORMAL HIGH (ref 65–99)
Potassium: 3.8 mmol/L (ref 3.5–5.1)
Sodium: 135 mmol/L (ref 135–145)

## 2017-03-16 ENCOUNTER — Encounter: Payer: Self-pay | Admitting: Internal Medicine

## 2017-03-16 ENCOUNTER — Non-Acute Institutional Stay (SKILLED_NURSING_FACILITY): Payer: Medicare Other | Admitting: Internal Medicine

## 2017-03-16 DIAGNOSIS — R000 Tachycardia, unspecified: Secondary | ICD-10-CM

## 2017-03-16 DIAGNOSIS — E876 Hypokalemia: Secondary | ICD-10-CM | POA: Diagnosis not present

## 2017-03-16 DIAGNOSIS — I10 Essential (primary) hypertension: Secondary | ICD-10-CM

## 2017-03-16 DIAGNOSIS — R52 Pain, unspecified: Secondary | ICD-10-CM

## 2017-03-16 NOTE — Progress Notes (Signed)
Location:   [place] Room Number: 151/P Place of Service:  SNF (31) Provider:  [place] Lewis  Pomposini, Cherly Anderson, MD  Patient Care Team: Pomposini, Cherly Anderson, MD as PCP - General (Internal Medicine) Gala Romney Cristopher Estimable, MD as Consulting Physician (Gastroenterology)  Extended Emergency Contact Information Primary Emergency Contact: Theodosia Paling Address: [place] [address] [name] of [place] Phone: [phone] Mobile Phone: [phone] Relation: Spouse Secondary Emergency Contact: Caprice Renshaw [place] of [place] Phone: [phone] Mobile Phone: [phone] Relation: Sister  Code Status:  Full Code Goals of care: Advanced Directive information Advanced Directives 03/16/2017  Does Patient Have a Medical Advance Directive? Yes  Type of Advance Directive (No Data)  Does patient want to make changes to medical advance directive? No - Patient declined  Copy of [place] in Chart? -  Would patient like information on creating a medical advance directive? -     Chief Complaint  Patient presents with  . Acute Visit    Pain Management  With intermitted tachycardia thought secondary to pain  HPI:  Pt is a 76 y.o. female seen today for an acute visit for pain management with history of tachycardia during therapy. She has a history of hypertension diabetes type 2 GERD hyperlipidemia as well as lymphocytic colitis.  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  She was treated with chemotherapy course was complicated by hypotension oral candidiasis right upper extremity DVT question as well as encephalopathy question cephapirin-induced complicated with drug rash in her cellar zoster infection.  She also has severe pancytopenia because of chemotherapy that required transfusion of platelets and  blood.  Her stay here actually has been quite stable she does have follow-up at [facility] early next week.  She did recently complain of tingling in her left leg and has been started on Neurontin and apparently this is helping.  She also receives tramadol every 6 hours as needed for pain although she does not take this much later in the day she does take it before therapy-however today she says the schedule before therapy with somewhat changed and she did not apparently receive her tramadol 1 hour before therapy-during therapy she said she had significant pain heart rate was found to be around 120 to 1:30-this was very transitory and says this occurred while she was experiencing pain She is on Coreg 3.125 mg twice a day-she is also on low dose Norvasc 5 mg a day-apparently antihypertensives were reduced secondary to hypotension concerns  Blood pressure was stable apparently during therapy when I reevaluated her later  When I evaluated her  In her room heart rate was at baseline in the 90s blood pressure was stable at 130/72 she continues to deny any chest pain or shortness of breath or palpitations she said she did not experience any of this in therapy either.  Regards other issues she did have hypokalemia 2 weeks ago with potassium 2.7 this was supplemented and has normalized and showed some stability potassium was 3.8 on lab done on October 15-appeared to possibly be trending down slightly will update this tomorrow to ensure stability.        Past Medical History:  Diagnosis Date  . Arthritis   . Back pain   . Diabetes ([place])   . Dyspnea   .  GERD (gastroesophageal reflux disease)   . Glaucoma   . History of kidney stones   . Hypercholesterolemia   . Hypertension   . Lymphocytic colitis 2015  . Scoliosis   . Tubular adenoma 2015   Past Surgical History:  Procedure Laterality Date  . ABDOMINAL HYSTERECTOMY     and bladder tac  . BACK SURGERY     spinal stenosis  . BALLOON  DILATION N/A 05/04/2014   Procedure: BALLOON DILATION;  Surgeon: Rogene Houston, MD;  Location: [facility];  Service: Endoscopy;  Laterality: N/A;  . CATARACT EXTRACTION, BILATERAL    . CHOLECYSTECTOMY N/A 05/27/2014   Procedure: LAPAROSCOPIC CHOLECYSTECTOMY;  Surgeon: Jamesetta So, MD;  Location: [facility] OR[facility];  Service: General;  Laterality: N/A;  . COLONOSCOPY  Oct 2012   Dr. Algis Greenhouse: few small rectal polyps s/p biopsy, adenomatous polyps  . COLONOSCOPY N/A 04/09/2014   Dr.Rourk- colonic diverticulosis,polyp removed, subtly abnormal colonic mucosa bx=lymphocytic colitis and tubular adenoma  . ERCP N/A 05/04/2014   Dr. Laural Golden- cystic duct was patent, biliary sphincterotomy performed and single pigmented stome removed with stone balloon extractor  . EYE SURGERY    . GALLBLADDER SURGERY    . GANGLION CYST EXCISION Right   . REMOVAL OF STONES N/A 05/04/2014   Procedure: REMOVAL OF STONES;  Surgeon: Rogene Houston, MD;  Location: [facility];  Service: Endoscopy;  Laterality: N/A;  . SPHINCTEROTOMY N/A 05/04/2014   Procedure: SPHINCTEROTOMY;  Surgeon: Rogene Houston, MD;  Location: [facility] OR[facility];  Service: Endoscopy;  Laterality: N/A;  . TONSILLECTOMY     age 77    Allergies  Allergen Reactions  . Byetta 10 Mcg Pen  [Exenatide]   . Codeine     Patient stated that she does not like taking any codeine   . Erythromycin Nausea And Vomiting  . Metformin   . Pioglitazone     Outpatient Encounter Prescriptions as of 03/16/2017  Medication Sig  . acetaminophen (TYLENOL) 500 MG tablet Take 1,000 mg by mouth every 6 (six) hours as needed for mild pain.  Marland Kitchen amLODipine (NORVASC) 5 MG tablet Take 5 mg by mouth daily.  . budesonide (ENTOCORT EC) 3 MG 24 hr capsule Take 6 mg by mouth daily.  . carvedilol (COREG) 3.125 MG tablet Take 3.125 mg by mouth 2 (two) times daily with a meal.  . Cholecalciferol (VITAMIN D) 2000 units CAPS Take 1 capsule by mouth daily.  Marland Kitchen esomeprazole (NEXIUM) 20 MG capsule Take 20 mg by mouth  daily at 12 noon.  . gabapentin (NEURONTIN) 100 MG capsule Take 100 mg by mouth at bedtime.  . insulin glargine (LANTUS) 100 UNIT/ML injection Inject 22 Units into the skin at bedtime.   . insulin lispro (HUMALOG KWIKPEN) 100 UNIT/ML KiwkPen Per sliding scale three times a day  . loperamide (IMODIUM A-D) 2 MG tablet Take 2 mg by mouth every 8 (eight) hours as needed for diarrhea or loose stools.  . magnesium 30 MG tablet Take 30 mg by mouth daily.  . mirtazapine (REMERON) 15 MG tablet Take 15 mg by mouth at bedtime.  . ondansetron (ZOFRAN) 8 MG tablet Take 8 mg by mouth every 8 (eight) hours as needed for nausea or vomiting.  . rosuvastatin (CRESTOR) 10 MG tablet Take 10 mg by mouth every evening.   . traMADol (ULTRAM) 50 MG tablet Take 1-2 mg by mouth every 6 (six) hours as needed for moderate pain or severe pain.  . [DISCONTINUED] Multiple Vitamin (MULTIVITAMIN WITH MINERALS)  TABS tablet Take 1 tablet by mouth daily.  . [DISCONTINUED] potassium chloride 20 MEQ/15ML (10%) SOLN Take 20 mEq by mouth 2 (two) times daily.    No facility-administered encounter medications on file as of 03/16/2017.     Review of Systems   General she does not complaining of any fever or chills.  Skin does not complain of diaphoresis or itching.  Head ears eyes mouth and throat does not complaining of  sore throat or visual changes.  Respiratory denies shortness of breath or cough.  Cardiac does not complain of chest pain edema appears to be fairly minimal.  GI does not complain of abdominal pain nausea vomiting diarrhea constipation in fact when I saw her later in the day she was eating a milkshake with her husband in the room.  GU does not complain of dysuria.   Muscle skeletal continues with weakness does complain at times of back pain--which appears to be fairly chronic-.  Neurologic does not complain of dizziness or headache does not complaining of tingling stay in her leg.  Psych continues to be  in good spirits does not complain ofr anxiety or depression   Immunization History  Administered Date(s) Administered  . Influenza,inj,quad, With Preservative 03/31/2016  . Pneumococcal Conjugate-13 03/31/2013  . Pneumococcal Polysaccharide-23 05/19/2007  . Tdap 06/01/2011  . Zoster 06/01/2011   Pertinent  Health Maintenance Due  Topic Date Due  . INFLUENZA VACCINE  04/01/2017 (Originally 12/29/2016)  . FOOT EXAM  04/01/2017 (Originally 06/23/1950)  . HEMOGLOBIN A1C  04/01/2017 (Originally 1940/12/26)  . OPHTHALMOLOGY EXAM  04/01/2017 (Originally 06/23/1950)  . URINE MICROALBUMIN  04/01/2017 (Originally 06/23/1950)  . COLONOSCOPY  04/09/2024  . DEXA SCAN  Completed  . PNA vac Low Risk Adult  Completed   No flowsheet data found. Functional Status Survey:    Vitals:   03/16/17 1451  BP: 130/72  Pulse: 95  She is afebrile and respirations are 20  Physical Exam   In general this is a pleasant elderly female in no distress resting comfortably in bed.  Her skin is warm and dry.  Oropharynx is clear mucous membranes moist.  Eyes visual acuity appears grossly intact.  Chest is clear to auscultation there is no labored breathing.  Heart is regular rate and rhythm in the 90s on exam could not appreciate a murmur gallop or rub.  She does not appear to have significant lower extremity edema.  Abdomen is soft nontender with positive bowel sounds.  Musculoskeletal is able to move all extremities 4 does not appear to have acute pain at this point but does with more extended activity and therapy.  Neurologic appears grossly intact her speech is clear she is alert pleasant and appropriate.  Psych she is alert and oriented pleasant and appropriate.  He  Labs reviewed:  Recent Labs  03/07/17 0600 03/11/17 1230 03/14/17 0945  NA 138 138 135  K 4.7 4.4 3.8  CL 102 103 101  CO2 26 27 25   GLUCOSE 146* 257* 272*  BUN 14 23* 18  CREATININE 0.81 1.08* 0.93  CALCIUM 9.4 9.4  9.4  MG  --  1.3*  --     Recent Labs  01/18/17 1733 01/21/17 1437 03/02/17 0700  AST 23 37 29  ALT 17 46 33  ALKPHOS 90 81 93  BILITOT 1.1 0.5 0.9  PROT 7.8 7.1 5.6*  ALBUMIN 3.7 3.6 2.8*    Recent Labs  01/18/17 1733  01/21/17 1437 01/26/17 0816 03/02/17 0700  WBC  10.8*  --  10.1 8.5 6.6  NEUTROABS 1.1*  --  1.3* 2.6  --   HGB 10.9*  --  9.9* 11.5* 9.3*  HCT 33.0*  --  30.3* 34.1* 28.1*  MCV 92.7  --  93.8 93.0 96.2  PLT 19*  < > 17* 25* 612*  < > = values in this interval not displayed. Lab Results  Component Value Date   TSH 1.759 01/21/2017   No results found for: HGBA1C No results found for: CHOL, HDL, LDLCALC, LDLDIRECT, TRIG, CHOLHDL  Significant Diagnostic Results in last 30 days:  No results found.  Assessment/Plan  #1-pain management---I did discuss options with patient including oxycodone she does have a codeine allergy-she says she prefers to stay on current medication tramadol she doesn't want anything too strong"-I have spoken with nursing as well as and they will try to encourage tramadol as needed she can have it every 6 hours although does not apparently take that often-also attempts will be made to try to get her tramadol about an hour before therapy   #2 tachycardia this appears transitory secondary to pain today her normal pulses in the 90s and that's what it was on reevaluation when she was resting-this will have to be monitored however blood pressure continues to be stable apparently she does have a history of hypotension with increased blood pressure management.  I discussed this with Dr. Lyndel Safe via phone as well and again will monitor I do note per chart review patient she will occasionally have tachycardia readings if this is persistent consider increasing her Coreg with close monitoring of her blood pressure  This point continues on Coreg 3.125 mg twice a day as well as Norvasc 5 mg a day.    #3 history of hypokalemia this appears to have  resolved will re-check lab however to ensure stability.--She also has a history of low magnesium was recently 1.3 on lab done on October 12 this is being supplemented  #4-acute myeloid leukemia-she does have follow-up scheduled with [facility] and apparently will be readmitted on October 22 have Port-A-Cath insertion and booster therapy  . #5-diabetes type 2 Lantus was recently increased secondary to elevated blood sugars later in the day morning sugars recently appear to be more in the higher 100s to low 200s-later in the day blood sugars have some variability but appear to have frequent readings in the 200s at 300 range will discuss increasing Lantus further with Dr. Lyndel Safe when she is in facility tomorrow   [phone]

## 2017-03-17 ENCOUNTER — Encounter (HOSPITAL_COMMUNITY)
Admission: RE | Admit: 2017-03-17 | Discharge: 2017-03-17 | Disposition: A | Discharge status: Home / Self Care | Payer: Medicare Other | Source: Skilled Nursing Facility | Attending: Internal Medicine | Admitting: Internal Medicine

## 2017-03-17 DIAGNOSIS — C9300 Acute monoblastic/monocytic leukemia, not having achieved remission: Secondary | ICD-10-CM | POA: Insufficient documentation

## 2017-03-17 DIAGNOSIS — D696 Thrombocytopenia, unspecified: Secondary | ICD-10-CM | POA: Insufficient documentation

## 2017-03-17 DIAGNOSIS — D61810 Antineoplastic chemotherapy induced pancytopenia: Secondary | ICD-10-CM | POA: Insufficient documentation

## 2017-03-17 DIAGNOSIS — I82A11 Acute embolism and thrombosis of right axillary vein: Secondary | ICD-10-CM | POA: Insufficient documentation

## 2017-03-17 LAB — CBC WITH DIFFERENTIAL/PLATELET
Basophils Absolute: 0 10*3/uL (ref 0.0–0.1)
Basophils Relative: 0 %
EOS PCT: 5 %
Eosinophils Absolute: 0.4 10*3/uL (ref 0.0–0.7)
HCT: 36.2 % (ref 36.0–46.0)
Hemoglobin: 11.7 g/dL — ABNORMAL LOW (ref 12.0–15.0)
LYMPHS ABS: 0.9 10*3/uL (ref 0.7–4.0)
LYMPHS PCT: 9 %
MCH: 32.5 pg (ref 26.0–34.0)
MCHC: 32.3 g/dL (ref 30.0–36.0)
MCV: 100.6 fL — AB (ref 78.0–100.0)
MONOS PCT: 9 %
Monocytes Absolute: 0.9 10*3/uL (ref 0.1–1.0)
Neutro Abs: 7.1 10*3/uL (ref 1.7–7.7)
Neutrophils Relative %: 77 %
PLATELETS: 134 10*3/uL — AB (ref 150–400)
RBC: 3.6 MIL/uL — AB (ref 3.87–5.11)
RDW: 19.2 % — ABNORMAL HIGH (ref 11.5–15.5)
WBC: 9.3 10*3/uL (ref 4.0–10.5)

## 2017-03-17 LAB — BASIC METABOLIC PANEL
Anion gap: 9 (ref 5–15)
BUN: 19 mg/dL (ref 6–20)
CHLORIDE: 101 mmol/L (ref 101–111)
CO2: 29 mmol/L (ref 22–32)
Calcium: 9.8 mg/dL (ref 8.9–10.3)
Creatinine, Ser: 0.82 mg/dL (ref 0.44–1.00)
GFR calc Af Amer: >60 mL/min (ref >60)
GLUCOSE: 197 mg/dL — AB (ref 65–99)
POTASSIUM: 4.2 mmol/L (ref 3.5–5.1)
Sodium: 139 mmol/L (ref 135–145)

## 2017-03-18 ENCOUNTER — Encounter (HOSPITAL_COMMUNITY)
Admission: RE | Admit: 2017-03-18 | Discharge: 2017-03-18 | Disposition: A | Discharge status: Home / Self Care | Payer: Medicare Other | Source: Skilled Nursing Facility | Attending: *Deleted | Admitting: *Deleted

## 2017-03-18 LAB — MAGNESIUM: MAGNESIUM: 1.2 mg/dL — AB (ref 1.7–2.4)

## 2017-03-30 ENCOUNTER — Telehealth (HOSPITAL_COMMUNITY): Payer: Self-pay

## 2017-03-30 NOTE — Telephone Encounter (Signed)
Received orders from [facility] for labs to be drawn for patient on 03/31/17 at [facility]. When I reviewed with provider, she said we do not treat AML and [facility] needs to contact lab and fax orders directly to them. Called and spoke with Joseph Art, pt. coordinator, at [facility], and told her we could not order the labs and why. Explained that [facility] needs to send the orders directly to lab at [facility] and set up appt and notify patient. Renee verbalized understanding and states she will take care of it and send message to provider.

## 2017-03-31 ENCOUNTER — Other Ambulatory Visit (HOSPITAL_COMMUNITY)
Admission: RE | Admit: 2017-03-31 | Discharge: 2017-03-31 | Disposition: A | Discharge status: Home / Self Care | Payer: Medicare Other | Source: Ambulatory Visit | Attending: Nurse Practitioner | Admitting: Nurse Practitioner

## 2017-03-31 DIAGNOSIS — D696 Thrombocytopenia, unspecified: Secondary | ICD-10-CM | POA: Insufficient documentation

## 2017-03-31 LAB — COMPREHENSIVE METABOLIC PANEL
ALT: 17 U/L (ref 14–54)
ANION GAP: 8 (ref 5–15)
AST: 23 U/L (ref 15–41)
Albumin: 3.3 g/dL — ABNORMAL LOW (ref 3.5–5.0)
Alkaline Phosphatase: 81 U/L (ref 38–126)
BUN: 21 mg/dL — ABNORMAL HIGH (ref 6–20)
CHLORIDE: 99 mmol/L — AB (ref 101–111)
CO2: 24 mmol/L (ref 22–32)
Calcium: 9.3 mg/dL (ref 8.9–10.3)
Creatinine, Ser: 0.87 mg/dL (ref 0.44–1.00)
GFR calc non Af Amer: >60 mL/min (ref >60)
Glucose, Bld: 274 mg/dL — ABNORMAL HIGH (ref 65–99)
POTASSIUM: 4 mmol/L (ref 3.5–5.1)
SODIUM: 131 mmol/L — AB (ref 135–145)
Total Bilirubin: 0.7 mg/dL (ref 0.3–1.2)
Total Protein: 5.9 g/dL — ABNORMAL LOW (ref 6.5–8.1)

## 2017-03-31 LAB — CBC WITH DIFFERENTIAL/PLATELET
BASOS PCT: 0 %
Basophils Absolute: 0 10*3/uL (ref 0.0–0.1)
EOS ABS: 0.3 10*3/uL (ref 0.0–0.7)
EOS PCT: 5 %
HCT: 29.5 % — ABNORMAL LOW (ref 36.0–46.0)
HEMOGLOBIN: 9.9 g/dL — AB (ref 12.0–15.0)
LYMPHS ABS: 1.1 10*3/uL (ref 0.7–4.0)
Lymphocytes Relative: 16 %
MCH: 34.4 pg — ABNORMAL HIGH (ref 26.0–34.0)
MCHC: 33.6 g/dL (ref 30.0–36.0)
MCV: 102.4 fL — ABNORMAL HIGH (ref 78.0–100.0)
MONOS PCT: 3 %
Monocytes Absolute: 0.2 10*3/uL (ref 0.1–1.0)
NEUTROS PCT: 76 %
Neutro Abs: 5 10*3/uL (ref 1.7–7.7)
PLATELETS: 19 10*3/uL — AB (ref 150–400)
RBC: 2.88 MIL/uL — AB (ref 3.87–5.11)
RDW: 16.7 % — AB (ref 11.5–15.5)
WBC: 6.6 10*3/uL (ref 4.0–10.5)

## 2017-03-31 LAB — PROTIME-INR
INR: 0.98
Prothrombin Time: 12.9 seconds (ref 11.4–15.2)

## 2017-03-31 LAB — MAGNESIUM: Magnesium: 1.6 mg/dL — ABNORMAL LOW (ref 1.7–2.4)

## 2017-04-07 ENCOUNTER — Other Ambulatory Visit (HOSPITAL_COMMUNITY)
Admission: RE | Admit: 2017-04-07 | Discharge: 2017-04-07 | Disposition: A | Discharge status: Home / Self Care | Payer: Medicare Other | Source: Ambulatory Visit | Attending: Nurse Practitioner | Admitting: Nurse Practitioner

## 2017-04-07 DIAGNOSIS — R69 Illness, unspecified: Secondary | ICD-10-CM | POA: Diagnosis present

## 2017-04-07 LAB — CBC WITH DIFFERENTIAL/PLATELET
BASOS ABS: 0 10*3/uL (ref 0.0–0.1)
Basophils Relative: 0 %
EOS ABS: 0.2 10*3/uL (ref 0.0–0.7)
EOS PCT: 4 %
HCT: 22.7 % — ABNORMAL LOW (ref 36.0–46.0)
HEMOGLOBIN: 7.5 g/dL — AB (ref 12.0–15.0)
LYMPHS ABS: 1.3 10*3/uL (ref 0.7–4.0)
Lymphocytes Relative: 30 %
MCH: 35 pg — AB (ref 26.0–34.0)
MCHC: 33 g/dL (ref 30.0–36.0)
MCV: 106.1 fL — ABNORMAL HIGH (ref 78.0–100.0)
Monocytes Absolute: 0.1 10*3/uL (ref 0.1–1.0)
Monocytes Relative: 3 %
NEUTROS PCT: 64 %
Neutro Abs: 2.9 10*3/uL (ref 1.7–7.7)
RBC: 2.14 MIL/uL — AB (ref 3.87–5.11)
RDW: 14.8 % (ref 11.5–15.5)
WBC: 4.5 10*3/uL (ref 4.0–10.5)

## 2017-04-07 LAB — COMPREHENSIVE METABOLIC PANEL
ALBUMIN: 3.3 g/dL — AB (ref 3.5–5.0)
ALK PHOS: 74 U/L (ref 38–126)
ALT: 15 U/L (ref 14–54)
AST: 23 U/L (ref 15–41)
Anion gap: 8 (ref 5–15)
BUN: 22 mg/dL — AB (ref 6–20)
CALCIUM: 9.4 mg/dL (ref 8.9–10.3)
CHLORIDE: 100 mmol/L — AB (ref 101–111)
CO2: 25 mmol/L (ref 22–32)
CREATININE: 0.83 mg/dL (ref 0.44–1.00)
GFR calc Af Amer: >60 mL/min (ref >60)
GFR calc non Af Amer: >60 mL/min (ref >60)
GLUCOSE: 230 mg/dL — AB (ref 65–99)
Potassium: 4.1 mmol/L (ref 3.5–5.1)
SODIUM: 133 mmol/L — AB (ref 135–145)
Total Bilirubin: 1 mg/dL (ref 0.3–1.2)
Total Protein: 5.9 g/dL — ABNORMAL LOW (ref 6.5–8.1)

## 2017-04-07 LAB — MAGNESIUM: Magnesium: 1.5 mg/dL — ABNORMAL LOW (ref 1.7–2.4)

## 2017-04-08 ENCOUNTER — Ambulatory Visit: Payer: Medicare Other | Admitting: Gastroenterology

## 2017-05-03 ENCOUNTER — Ambulatory Visit: Payer: Medicare Other | Admitting: Internal Medicine

## 2017-06-14 ENCOUNTER — Ambulatory Visit (INDEPENDENT_AMBULATORY_CARE_PROVIDER_SITE_OTHER): Payer: Medicare Other | Admitting: Internal Medicine

## 2017-06-14 ENCOUNTER — Encounter: Payer: Self-pay | Admitting: Internal Medicine

## 2017-06-14 VITALS — BP 122/67 | HR 94 | Temp 97.0°F | Ht 63.0 in | Wt 132.4 lb

## 2017-06-14 DIAGNOSIS — K52832 Lymphocytic colitis: Secondary | ICD-10-CM | POA: Diagnosis not present

## 2017-06-14 DIAGNOSIS — K219 Gastro-esophageal reflux disease without esophagitis: Secondary | ICD-10-CM | POA: Diagnosis not present

## 2017-06-14 NOTE — Progress Notes (Signed)
Primary Care Physician:  Pomposini, Cherly Anderson, MD Primary Gastroenterologist:  Dr. Gala Romney  Pre-Procedure History & Physical: HPI:  Diane Hawkins is a 77 y.o. female here for follow-up of GERD and lymphocytic colitis. Unfortunately, diagnosed with AML last year. Has undergone multiple rounds of chemotherapy. Felt to have relapsed recently. Sounds like she's getting salvage therapy now. [facility] oncologist called me last fall regarding recurrence or diarrhea. History of lymphocytic colitis  -  per recommendation go back on budesonide. She stated that rapidly improved her diarrhea. She only has occasional loose stools -  simply takes Imodium for now. Reflux symptoms well controlled on Nexium 40 mg daily. No dysphagia.  Past Medical History:  Diagnosis Date  . Arthritis   . Back pain   . Diabetes ([place])   . Dyspnea   . GERD (gastroesophageal reflux disease)   . Glaucoma   . History of kidney stones   . Hypercholesterolemia   . Hypertension   . Lymphocytic colitis 2015  . Scoliosis   . Tubular adenoma 2015    Past Surgical History:  Procedure Laterality Date  . ABDOMINAL HYSTERECTOMY     and bladder tac  . BACK SURGERY     spinal stenosis  . BALLOON DILATION N/A 05/04/2014   Procedure: BALLOON DILATION;  Surgeon: Rogene Houston, MD;  Location: [facility];  Service: Endoscopy;  Laterality: N/A;  . CATARACT EXTRACTION, BILATERAL    . CHOLECYSTECTOMY N/A 05/27/2014   Procedure: LAPAROSCOPIC CHOLECYSTECTOMY;  Surgeon: Jamesetta So, MD;  Location: [facility];  Service: General;  Laterality: N/A;  . COLONOSCOPY  Oct 2012   Dr. Algis Greenhouse: few small rectal polyps s/p biopsy, adenomatous polyps  . COLONOSCOPY N/A 04/09/2014   Dr.Feliciana Narayan- colonic diverticulosis,polyp removed, subtly abnormal colonic mucosa bx=lymphocytic colitis and tubular adenoma  . ERCP N/A 05/04/2014   Dr. Laural Golden- cystic duct was patent, biliary sphincterotomy performed and single pigmented stome removed with stone balloon extractor    . EYE SURGERY    . GALLBLADDER SURGERY    . GANGLION CYST EXCISION Right   . REMOVAL OF STONES N/A 05/04/2014   Procedure: REMOVAL OF STONES;  Surgeon: Rogene Houston, MD;  Location: [facility];  Service: Endoscopy;  Laterality: N/A;  . SPHINCTEROTOMY N/A 05/04/2014   Procedure: SPHINCTEROTOMY;  Surgeon: Rogene Houston, MD;  Location: [facility];  Service: Endoscopy;  Laterality: N/A;  . TONSILLECTOMY     age 89    Prior to Admission medications   Medication Sig Start Date End Date Taking? Authorizing Provider  acetaminophen (TYLENOL) 500 MG tablet Take 1,000 mg by mouth every 6 (six) hours as needed for mild pain.   Yes [provider]  carvedilol (COREG) 12.5 MG tablet Take 12.5 mg by mouth 2 (two) times daily.   Yes [provider]  esomeprazole (NEXIUM) 20 MG capsule Take 20 mg by mouth daily at 12 noon.   Yes [provider]  gabapentin (NEURONTIN) 100 MG capsule Take 200 mg by mouth at bedtime.    Yes [provider]  insulin aspart (NOVOLOG) 100 UNIT/ML injection Inject into the skin 3 (three) times daily. Sliding scale   Yes [provider]  Insulin Detemir (LEVEMIR [place]) Inject 20 Units into the skin at bedtime.   Yes [provider]  loperamide (IMODIUM A-D) 2 MG tablet Take 2 mg by mouth every 8 (eight) hours as needed for diarrhea or loose stools.   Yes [provider]  magnesium 30 MG  tablet Take 30 mg by mouth 2 (two) times daily.    Yes [provider]  mirtazapine (REMERON) 15 MG tablet Take 15 mg by mouth at bedtime.   Yes [provider]  ondansetron (ZOFRAN) 8 MG tablet Take 8 mg by mouth every 8 (eight) hours as needed for nausea or vomiting.   Yes [provider]  rosuvastatin (CRESTOR) 10 MG tablet Take 10 mg by mouth every evening.    Yes [provider]  traMADol (ULTRAM) 50 MG tablet Take 50 mg by mouth 2 (two) times daily.    Yes [provider]  amLODipine (NORVASC) 5  MG tablet Take 5 mg by mouth daily.    [provider]  budesonide (ENTOCORT EC) 3 MG 24 hr capsule Take 6 mg by mouth daily.    [provider]  carvedilol (COREG) 3.125 MG tablet Take 3.125 mg by mouth 2 (two) times daily with a meal.    [provider]  Cholecalciferol (VITAMIN D) 2000 units CAPS Take 1 capsule by mouth daily.    [provider]  insulin glargine (LANTUS) 100 UNIT/ML injection Inject 22 Units into the skin at bedtime.     [provider]  insulin lispro (HUMALOG KWIKPEN) 100 UNIT/ML KiwkPen Per sliding scale three times a day    [provider]    Allergies as of 06/14/2017 - Review Complete 06/14/2017  Allergen Reaction Noted  . Byetta 10 mcg pen  [exenatide]    . Codeine  04/03/2014  . Erythromycin Nausea And Vomiting 04/03/2014  . Metformin    . Pioglitazone      Family History  Problem Relation Age of Onset  . Diverticulitis Mother   . Ulcers Mother   . Colon cancer Neg Hx     Social History   Socioeconomic History  . Marital status: Married    Spouse name: Not on file  . Number of children: Not on file  . Years of education: Not on file  . Highest education level: Not on file  Social Needs  . Financial resource strain: Not on file  . Food insecurity - worry: Not on file  . Food insecurity - inability: Not on file  . Transportation needs - medical: Not on file  . Transportation needs - non-medical: Not on file  Occupational History  . Occupation: Retired Therapist, sports  Tobacco Use  . Smoking status: Former Smoker    Packs/day: 0.50    Years: 25.00    Pack years: 12.50    Types: E-cigarettes    Last attempt to quit: 02/20/2005    Years since quitting: 12.3  . Smokeless tobacco: Never Used  . Tobacco comment: E-Cigarette  Substance and Sexual Activity  . Alcohol use: No    Alcohol/week: 0.0 oz  . Drug use: No  . Sexual activity: Yes    Birth control/protection: Surgical  Other Topics Concern  .  Not on file  Social History Narrative  . Not on file    Review of Systems: See HPI, otherwise negative ROS  Physical Exam: BP 122/67   Pulse 94   Temp (!) 97 F (36.1 C) (Oral)   Ht 5\' 3"  (1.6 m)   Wt 132 lb 6.4 oz (60.1 kg)   BMI 23.45 kg/m  General:   Pleasant but chronically ill lady in a wheelchair. Accompanied by her husband. Lungs:  Clear throughout to auscultation.   No wheezes, crackles, or rhonchi. No acute distress. Heart:  Regular rate  and rhythm; no murmurs, clicks, rubs,  or gallops. Abdomen: Non-distended, normal bowel sounds.  Soft and nontender without appreciable mass or hepatosplenomegaly.   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 History of GERD well controlled on Nexium 40 mg daily. Her biggest health issue at this time is ongoing treatment for AML. She is pancytopenic and profoundly thrombocytopenic.  Recommendations:   Continue Nexium 40 mg daily.  May continue using Imodium on an as-needed basis. If a significant diarrhea recurs, she is to let me know. Plan to see her back in 6 months and when necessary.             Notice: This dictation was prepared with Dragon dictation along with smaller phrase technology. Any transcriptional errors that result from this process are unintentional and may not be corrected upon review.

## 2017-06-14 NOTE — Patient Instructions (Signed)
Continue Nexium 40 mg daily  May use Imodium as needed  Call me if diarrhea recurs  Office visit in 6 months

## 2017-06-27 ENCOUNTER — Telehealth: Payer: Self-pay

## 2017-06-27 NOTE — Telephone Encounter (Signed)
Received a call from Bayard Hugger, NP from [place] [facility]. Clarise Cruz, NP was calling in reference to pts colitis.   LM for NP to call me back, NP stated they would be back in the office next week and she would try to reach out concerning pt. Contact # [phone].

## 2017-06-28 ENCOUNTER — Telehealth: Payer: Self-pay

## 2017-06-28 NOTE — Telephone Encounter (Signed)
Received a call from Chinita Pester, Veteran from [place]. Waiting on a return call.   [facility], returned called. Pt was given Venetoclax 100mg  tab on Saturday and wasn't able to continue medication due to uncontrollable diarrhea. Venetoclax usually starts with a 100mg  tab on day 1 and 200mg  on day 2 and 400mg  on day 4. Venetoclax is a drug that was FDA approved 03/2017. Megan Manual, PA stated that Dr. Lissa Merlin didn't like the option on premedicating pt with Imodium or Lomtil due to increased risk of developing Cdiff.   Megan Manual would like to know your thoughts/recommendations because they really want the pt to take this medication. The only thing they can think caused her diarrhea is her Colitis. \  Megan Manual, PA pager [phone], [facility] main number [phone].

## 2017-06-30 NOTE — Telephone Encounter (Signed)
Spoke to [employer] PA and the patient.  Patient had lots of diarrhea after 100 mg dose of Venetolax.  Then diarrhea resolved. Hasn't had diarrhea in a while. So I'm not sure if patient has active microscopic colitis or not. They would very much like for the patient to take this medication.  I have recommended pre-medication with Bentyl 10 mg before meals and at bedtime beginning he night before she is re-challenged.    Side effect of diarrhea is up to 40% with this new medication.  If she is still intolerant in spite of taking Bentyl;  May consider pre-medicating with Entocort just to cover any component of subclinical microscopic colitis. She does have baseline IBS.  Let's call in Bentyl 10 mg tablets to [facility] on [address] [place]. Dispense 40. Take (1) 20 minutes before meals and at bedtime up to 4 times a day.  5 refills

## 2017-07-01 ENCOUNTER — Other Ambulatory Visit: Payer: Self-pay

## 2017-07-01 MED ORDER — DICYCLOMINE HCL 10 MG PO CAPS: ORAL_CAPSULE | ORAL | 5 refills | Status: AC

## 2017-07-01 NOTE — Telephone Encounter (Signed)
Noted, Bentyl sent in to [place].

## 2017-07-01 NOTE — Progress Notes (Signed)
bent

## 2017-07-25 ENCOUNTER — Telehealth: Payer: Self-pay | Admitting: Internal Medicine

## 2017-07-25 ENCOUNTER — Other Ambulatory Visit: Payer: Self-pay

## 2017-07-25 MED ORDER — BUDESONIDE 3 MG PO CPEP: 9.0000 mg | ORAL_CAPSULE | Freq: Every day | ORAL | 0 refills | Status: DC

## 2017-07-25 MED ORDER — BUDESONIDE 3 MG PO CPEP: 9.0000 mg | ORAL_CAPSULE | Freq: Every day | ORAL | 1 refills | Status: AC

## 2017-07-25 NOTE — Telephone Encounter (Signed)
Lmom, waiting on a return call.  

## 2017-07-25 NOTE — Telephone Encounter (Signed)
Spoke to me begin manual in [facility] outpatient. Neutropenic on chemotherapy. No diarrhea until they said to start prophylactic antibiotics. Has been intolerant of [place] omentum was working until antibiotic started. Not felt to have C. difficile.  We will most add Entocort to her regimen to cover any component of lymphocytic colitis if it were to be an issue.  Per plan, will restart Entocort 3 mg tablets. Let's dispense 90. Prescription will be for (3) 3 mg tablets or 9 mg daily for one month. One refill on this prescription.  Please call into her pharmacy in [place] and let her know.

## 2017-07-26 NOTE — Telephone Encounter (Signed)
Pt left a VM today 07/26/17 @ 1:04pm asking for a VM to be left with call details in reference to message left yesterday. Pt notified of new rx plan. RX was sent into pts pharmacy.

## 2018-01-24 IMAGING — CT CT BIOPSY
2 series · 12 of 16 positions shown, 14 images · non-contrast
Comparison: none

INDICATION: Thrombocytopenia

[Series 2: i-spiral 3.0 b30f · axial · 0.84mm/px · z∈[-215,-131]mm · 5 of 42 slices shown, 7 images]
[im 7/42  soft-tissue]
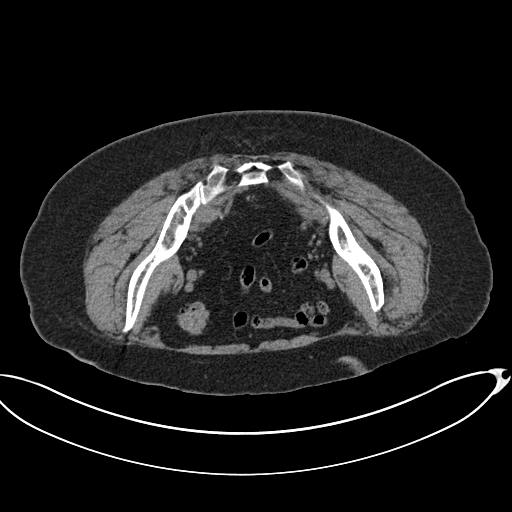
[im 7/42  bone]
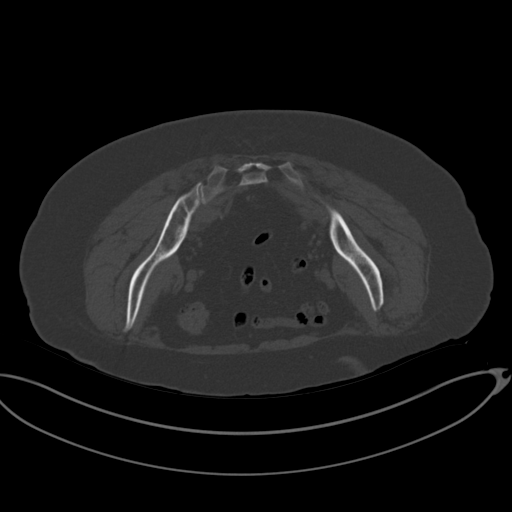
[im 14/42  soft-tissue]
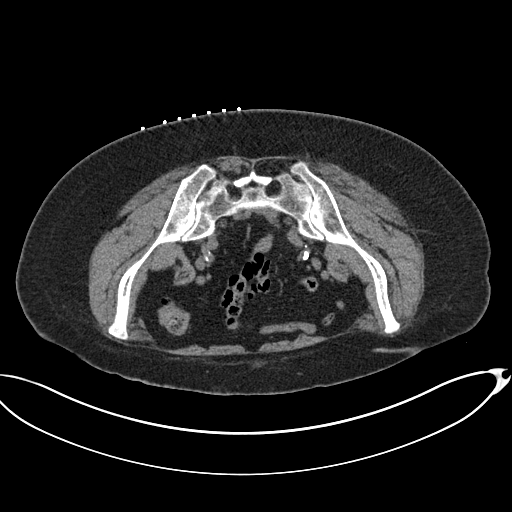
[im 21/42  soft-tissue]
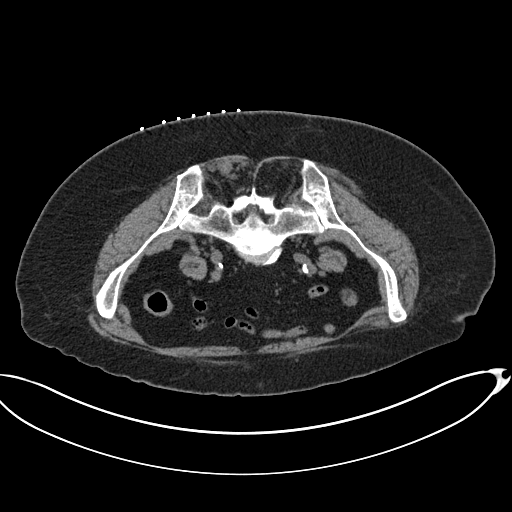
[im 28/42  soft-tissue]
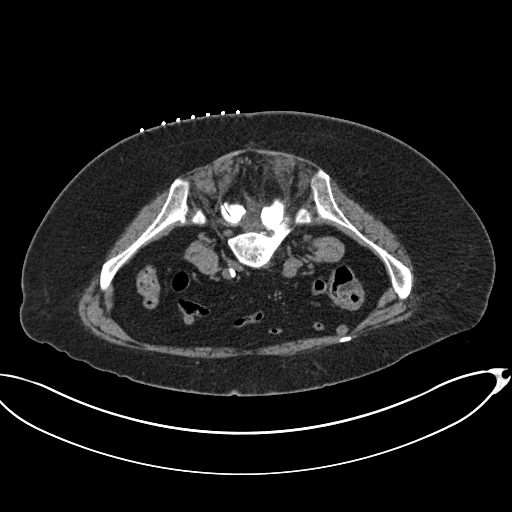
[im 35/42  soft-tissue]
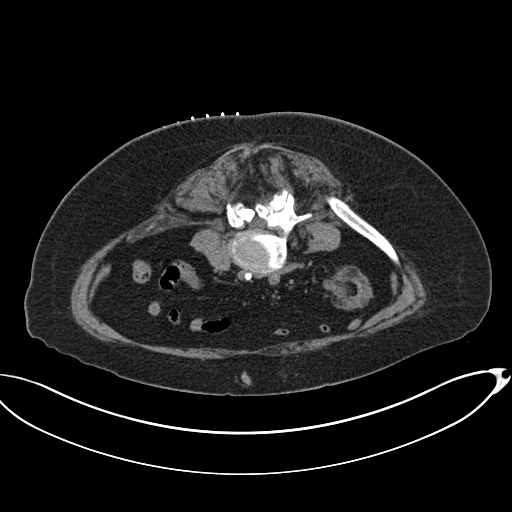
[im 35/42  bone]
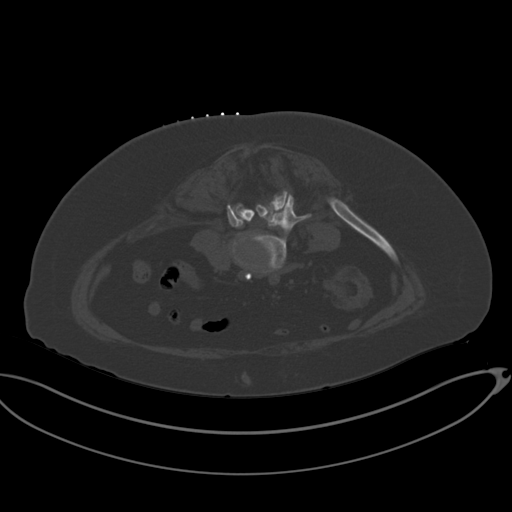

[Series 3: i-sequence 2.4 b30s · axial · 0.84mm/px · z∈[-197,-186]mm · 7 of 48 slices shown]
[im 6/48  soft-tissue]
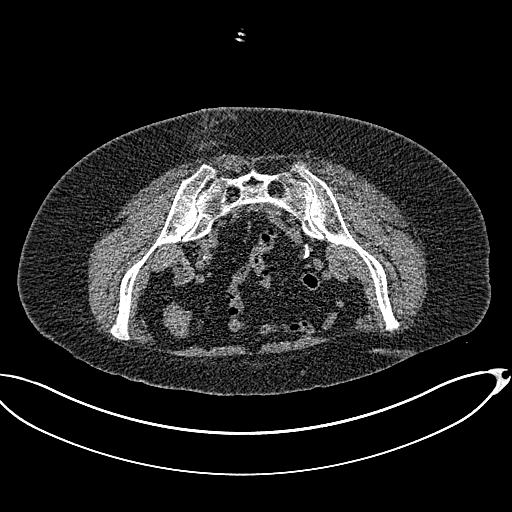
[im 12/48  soft-tissue]
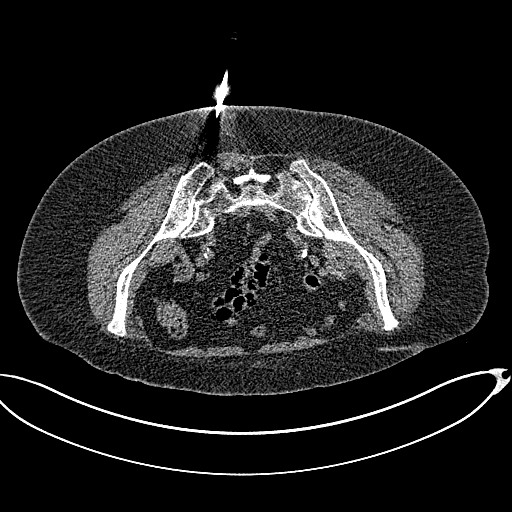
[im 18/48  soft-tissue]
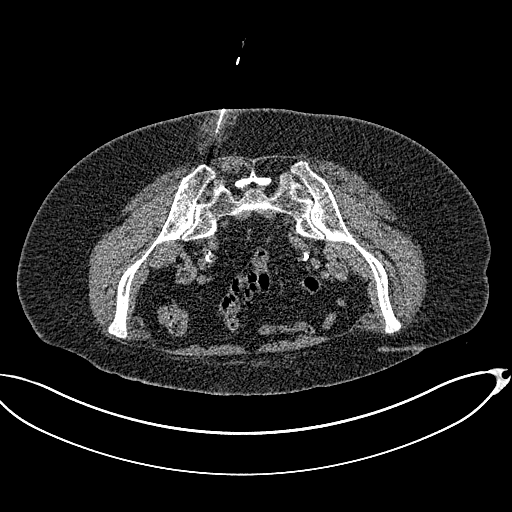
[im 24/48  soft-tissue]
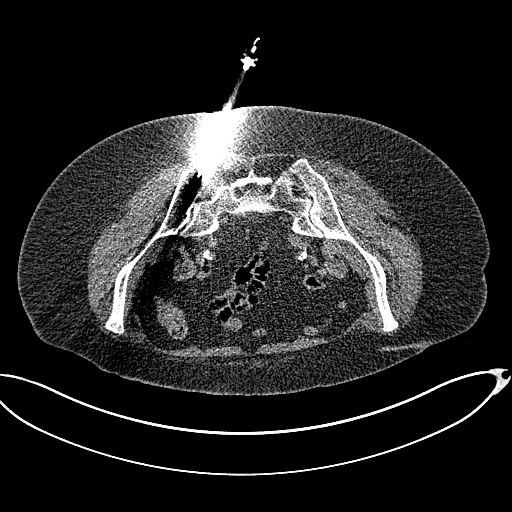
[im 30/48  soft-tissue]
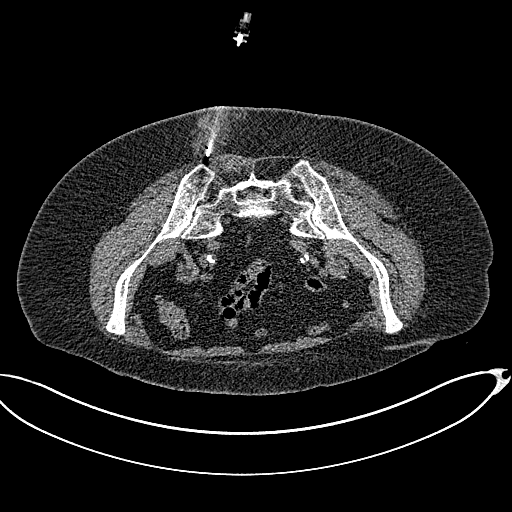
[im 36/48  soft-tissue]
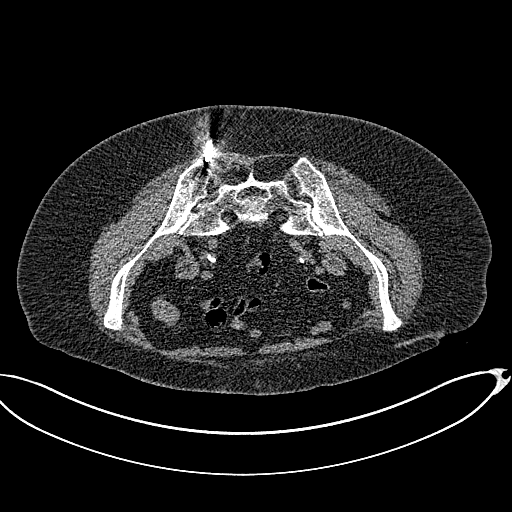
[im 42/48  soft-tissue]
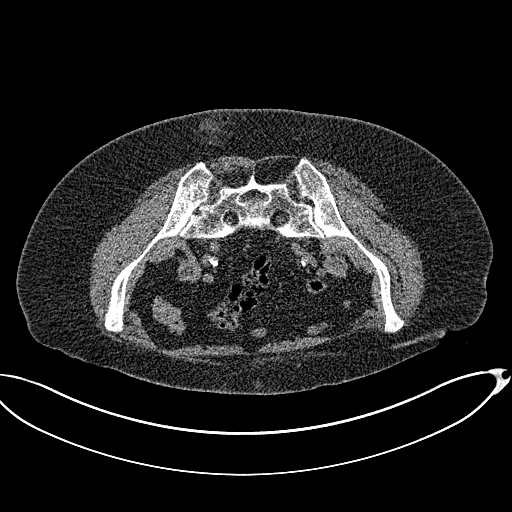

[12 of 16 positions shown; findings below may reference images not displayed]

EXAM:
CT-guided bone marrow biopsy

MEDICATIONS:
None.

ANESTHESIA/SEDATION:
Moderate (conscious) sedation was employed during this procedure. A
total of Fentanyl 50 mcg was administered intravenously.

Moderate Sedation Time: 10 minutes. The patient's level of
consciousness and vital signs were monitored continuously by
radiology nursing throughout the procedure under my direct
supervision.

FLUOROSCOPY TIME:  Not applicable

COMPLICATIONS:
None immediate.

PROCEDURE:
Informed written consent was obtained from the patient after a
thorough discussion of the procedural risks, benefits and
alternatives. All questions were addressed. Maximal Sterile Barrier
Technique was utilized including caps, mask, sterile gowns, sterile
gloves, sterile drape, hand hygiene and skin antiseptic. A timeout
was performed prior to the initiation of the procedure.

Initial CT scanning was performed for localization of the left iliac
bone adjacent to the sacroiliac joint. Utilizing CT fluoroscopic
guidance and 0.25% Marcaine as a local and deep periosteal
anesthetic, a bone marrow biopsy needle was placed into the left
iliac bone. Initial aspirate was performed and sent for pathologic
evaluation. Subsequently, a a single 3 cm core was obtained without
difficulty and separated into 2 smaller cores. These were also sent
for pathologic evaluation. The puncture site was then dressed in the
standard sterile manner. The patient tolerated the procedure well
and was returned her room in satisfactory condition.
IMPRESSION: Successful CT-guided bone marrow core and aspiration biopsy as
described

## 2018-07-04 ENCOUNTER — Telehealth: Payer: Self-pay | Admitting: Internal Medicine

## 2018-07-04 ENCOUNTER — Other Ambulatory Visit: Payer: Self-pay

## 2018-07-04 DIAGNOSIS — R197 Diarrhea, unspecified: Secondary | ICD-10-CM

## 2018-07-04 NOTE — Telephone Encounter (Signed)
Has she been on any antibiotics in the last 3 or 4 months?.  If so we need a C. difficile stool check.  If not, we will go directly to Entocort 9 mg daily.  Dispense 30-3 mg tablets.  Take 9 mg daily.  1 refill.  If diarrhea not markedly improved in the next 5 to 7 days, will still need stool studies.

## 2018-07-04 NOTE — Addendum Note (Signed)
Addended by: Derrick Ravel on: 10/05/3460 19:47 PM   Modules accepted: Orders

## 2018-07-04 NOTE — Telephone Encounter (Signed)
Pt called back, orders placed for Labcorop.

## 2018-07-04 NOTE — Telephone Encounter (Signed)
Pt called asking for an RX Entocort which has been taken in the past. Pt has leukemia and said [facility] understands her condition. She says her diarrhea comes out of nowhere and it's embarrassing at other doctor appointments.

## 2018-07-04 NOTE — Telephone Encounter (Signed)
Spoke with pt. She was on Cipro about 1 month ago and feels a Cdiff was checked. She will call back tomorrow after she discusses this with her doctor. If Cdiff was checked and negative, medication will be called in. If Cdiff was't checked, orders will be placed.

## 2018-07-04 NOTE — Telephone Encounter (Signed)
[phone] PLEASE CALL PATIENT, SHE IS HAVING SOME SYMPTOMS OF A FLAIR UP AND WOULD LIKE SOME MEDICATION CALLED IN

## 2018-07-07 ENCOUNTER — Other Ambulatory Visit: Payer: Self-pay

## 2018-07-07 LAB — CLOSTRIDIUM DIFFICILE BY PCR: Toxigenic C. Difficile by PCR: NEGATIVE

## 2018-07-07 MED ORDER — BUDESONIDE 3 MG PO CPEP: 9.0000 mg | ORAL_CAPSULE | Freq: Every day | ORAL | 1 refills | Status: DC

## 2018-08-08 ENCOUNTER — Telehealth: Payer: Self-pay | Admitting: Internal Medicine

## 2018-08-08 NOTE — Telephone Encounter (Signed)
Pt asked for AM to call her back at [phone]

## 2018-08-09 ENCOUNTER — Other Ambulatory Visit: Payer: Self-pay

## 2018-08-09 NOTE — Telephone Encounter (Signed)
Spoke to pt. She was taking Enotocort 9 mg daily. #30 c 1 rf was given to pt per [facility]. Pt said that wasn't a full month for her since she took the 3 pills daily. Pt would like a refill for 1 months worth of pills. Pt's diarrhea has improved significantly while taking medication.

## 2018-08-10 MED ORDER — BUDESONIDE 3 MG PO CPEP: 9.0000 mg | ORAL_CAPSULE | Freq: Every day | ORAL | 1 refills | Status: AC

## 2018-08-10 NOTE — Addendum Note (Signed)
Addended by: Gordy Levan, ERIC A on: 08/10/2018 04:10 PM   Modules accepted: Orders

## 2018-08-10 NOTE — Telephone Encounter (Signed)
Sent!

## 2018-08-11 NOTE — Telephone Encounter (Signed)
Pt.notified

## 2018-11-29 DEATH — deceased
